# Patient Record
Sex: Male | Born: 1975 | Race: Black or African American | Hispanic: No | Marital: Single | Smoking: Never smoker
Health system: Southern US, Community
[De-identification: ages and names within clinical notes are randomized; demographics above are authoritative.]

## PROBLEM LIST (undated history)

## (undated) DIAGNOSIS — F419 Anxiety disorder, unspecified: Secondary | ICD-10-CM

## (undated) DIAGNOSIS — IMO0001 Reserved for inherently not codable concepts without codable children: Secondary | ICD-10-CM

---

## 2004-09-28 ENCOUNTER — Emergency Department (HOSPITAL_COMMUNITY): Admission: EM | Admit: 2004-09-28 | Discharge: 2004-09-28 | Payer: Self-pay | Admitting: Emergency Medicine

## 2005-12-11 ENCOUNTER — Emergency Department (HOSPITAL_COMMUNITY): Admission: EM | Admit: 2005-12-11 | Discharge: 2005-12-11 | Payer: Self-pay | Admitting: Emergency Medicine

## 2008-08-07 ENCOUNTER — Emergency Department (HOSPITAL_COMMUNITY): Admission: EM | Admit: 2008-08-07 | Discharge: 2008-08-07 | Payer: Self-pay | Admitting: Emergency Medicine

## 2010-03-11 ENCOUNTER — Emergency Department (HOSPITAL_COMMUNITY)
Admission: EM | Admit: 2010-03-11 | Discharge: 2010-03-11 | Payer: Self-pay | Source: Home / Self Care | Admitting: Family Medicine

## 2011-07-22 ENCOUNTER — Encounter (HOSPITAL_COMMUNITY): Payer: Self-pay | Admitting: Emergency Medicine

## 2011-07-22 ENCOUNTER — Emergency Department (HOSPITAL_COMMUNITY): Payer: Self-pay

## 2011-07-22 ENCOUNTER — Emergency Department (HOSPITAL_COMMUNITY)
Admission: EM | Admit: 2011-07-22 | Discharge: 2011-07-22 | Disposition: A | Discharge status: Home / Self Care | Payer: Self-pay | Attending: Emergency Medicine | Admitting: Emergency Medicine

## 2011-07-22 DIAGNOSIS — F1210 Cannabis abuse, uncomplicated: Secondary | ICD-10-CM | POA: Insufficient documentation

## 2011-07-22 DIAGNOSIS — R002 Palpitations: Secondary | ICD-10-CM

## 2011-07-22 DIAGNOSIS — IMO0001 Reserved for inherently not codable concepts without codable children: Secondary | ICD-10-CM | POA: Insufficient documentation

## 2011-07-22 DIAGNOSIS — I498 Other specified cardiac arrhythmias: Secondary | ICD-10-CM | POA: Insufficient documentation

## 2011-07-22 DIAGNOSIS — R011 Cardiac murmur, unspecified: Secondary | ICD-10-CM | POA: Insufficient documentation

## 2011-07-22 HISTORY — DX: Reserved for inherently not codable concepts without codable children: IMO0001

## 2011-07-22 LAB — GLUCOSE, CAPILLARY: Glucose-Capillary: 77 mg/dL (ref 70–99)

## 2011-07-22 MED ORDER — SODIUM CHLORIDE 0.9 % IV BOLUS (SEPSIS)
1000.0000 mL | Freq: Once | INTRAVENOUS | Status: AC
  Administered 2011-07-22: 1000 mL via INTRAVENOUS

## 2011-07-22 NOTE — Discharge Instructions (Signed)
RESOURCE GUIDE  Dental Problems  Patients with Medicaid: [facility]                     [address]                                           Phone:  [phone]                                                  If unable to pay or uninsured, contact:  [facility] or [facility] to become qualified for the adult dental clinic.  Chronic Pain Problems Contact [facility]  [phone] Patients need to be referred by their primary care doctor.  Insufficient Money for Medicine Contact [employer]:  call "211" or [facility] 271-5999.  No Primary Care Doctor Call [facility]  832-8000 Other agencies that provide inexpensive medical care    [facility]  [phone]    [facility]  [phone]    [facility]  [phone]    [facility]  [phone]    [facility]  [phone]    [facility]  [phone]  Substance Abuse Resources [facility]  336-882-2125 [facility] 336-784-9470 [facility] 336-285-9073 [facility] 336-845-3988 Residential & Outpatient Substance Abuse Program  800-659-3381  Psychological Services [facility]  832-9600 [facility]  [phone] [facility]   [phone] (emergency services 641-4993)  Abuse/Neglect [place] [facility] Hotline (336) 641-3795 [place] [facility] Hotline 800-378-5315 (After Hours)  Emergency Shelter [facility] (336) 271-5985  [facility] (336) 275-9566 [facility] (704) 372-4663  MRSA Hotline #:   832-7006    [place] [facility]  [facility] [facility]  [employer]                           [facility] [address] [place]                     [address]         [address]  [place]                                                [place]                              [place] Phone:  [phone]                                  Phone:  [phone]                   Phone:  [phone]  [facility] Phone:  [phone]  [place] Child Abuse Hotline (336) 342-1394 (336)   119-1478 (After Hours)Drug Abuse, Frequently Asked Questions Drug addiction is a complex brain disease. It is characterized by compulsive, at times uncontrollable, drug craving, seeking, and use that persists even in the face of extremely negative results. Drug seeking becomes compulsive, in large part as a result of the effects of prolonged drug use on brain functioning and, thus, on behavior. For many people, drug addiction becomes chronic, with relapses possible even after long periods of being off the drug. HOW QUICKLY CAN I BECOME ADDICTED TO A DRUG? There is no easy answer to this. If and how quickly you might become addicted to a drug depends on many factors including the biology of your body. All drugs are potentially harmful and may have life-threatening consequences associated with their use. There are also vast differences among individuals in sensitivity to various drugs. While one person may use a drug many times and suffer no ill effects, another person may be particularly vulnerable and overdose or developing a craving with the first use. There is no way of knowing in advance how someone may react. HOW DO I KNOW IF SOMEONE IS ADDICTED TO DRUGS? If a person is compulsively seeking and using a drug despite negative consequences (such as loss of job, debt, physical problems brought on by drug abuse, or family problems) then he or she is probably addicted. Those who screen for drug problems, such as physicians, have developed the CAGE questionnaire. These four simple questions can help detect substance abuse problems:  Have you ever felt you ought to Cut down on your drinking/drug use?   Have people ever  Annoyed you by criticizing your drinking/drug use?   Have you ever felt bad or Guilty about your drinking/drug use?   Have you ever had a drink or taken a drug first thing in the morning to steady your nerves or get rid of a hangover (Eye-opener)?  WHAT ARE THE PHYSICAL SIGNS OF ABUSE OR ADDICTION? The physical signs of abuse or addiction can vary depending on the person and the drug being abused. For example, someone who abuses marijuana may have a chronic cough or worsening of asthmatic conditions. THC, the chemical in marijuana responsible for producing its effects, is associated with weakening the immune system which makes the user more vulnerable to infections, such as pneumonia. Each drug has short-term and long-term physical effects. Stimulants like cocaine increase heart rate and blood pressure, whereas opioids like heroin may slow the heart rate and reduce breathing (respiration).  ARE THERE EFFECTIVE TREATMENTS FOR DRUG ADDICTION? Drug addiction can be effectively treated with behavioral-based therapies and, for addiction to some drugs such as heroin or nicotine, medications may be used. Treatment may vary for each person depending on the type of drug(s) being used and multiple courses of treatment may be needed to achieve success. Research has revealed 13 basic principles that underlie effective drug addiction treatment. These are discussed in NIDA's Principles of Drug Addiction Treatment: A Research-Based Guide. WHERE CAN I FIND INFORMATION ABOUT DRUG TREATMENT PROGRAMS?  For referrals to treatment programs, visit the Substance Abuse and Mental Health Services Administration online at http://findtreatment.http://gonzalez-rivas.net/.   NIDA publishes an expanding series of treatment manuals, the "clinical toolbox," that gives drug treatment providers research-based information for creating effective treatment programs.  WHAT IS DETOXIFICATION, OR "DETOX"? Detoxification is the process of allowing the body  to rid itself of a drug while managing the symptoms of withdrawal. It is often the first step in a drug treatment program and should be  followed by treatment with a behavioral-based therapy and/or a medication, if available. Detox alone with no follow-up is not treatment.  WHAT IS WITHDRAWAL? HOW LONG DOES IT LAST? Withdrawal is the variety of symptoms that occur after use of some addictive drugs is reduced or stopped. Length of withdrawal and symptoms vary with the type of drug. For example, physical symptoms of heroin withdrawal may include restlessness, muscle and bone pain, insomnia, diarrhea, vomiting, and cold flashes. These physical symptoms may last for several days, but the general depression, or dysphoria (opposite of euphoria), that often accompanies heroin withdrawal, may last for weeks. In many cases withdrawal can be easily treated with medications to ease the symptoms. But treating withdrawal is not the same as treating addiction.  WHAT ARE THE COSTS OF DRUG ABUSE TO SOCIETY? Beyond the raw numbers are other costs to society:  Spread of infectious diseases such as HIV/AIDS and hepatitis C either through sharing of drug paraphernalia or unprotected sex.   Deaths due to overdose or other complications from drug use.   Effects on unborn children of pregnant drug users.   Other effects such as crime and homelessness.  IF A PREGNANT WOMAN ABUSES DRUGS, DOES IT AFFECT THE FETUS?  Many substances including alcohol, nicotine, and drugs of abuse can have negative effects on the developing fetus because they are transferred to the fetus across the placenta. For example, nicotine has been connected with premature birth and low birth weight, as has the use of cocaine. Scientific studies have shown that babies born to marijuana users were shorter, weighed less, and had smaller head sizes than those born to mothers who did not use the drug. Smaller babies are more likely to develop health problems.     Whether a baby's health problems, if caused by a drug, will continue as the child grows, is not always known. Research does show that children born to mothers who used marijuana regularly during pregnancy may have trouble concentrating, when older. Our research continues to produce insights on the negative effects of drug use on the fetus.  Document Released: 02/14/2003 Document Revised: 01/31/2011 Document Reviewed: 05/13/2008 [facility] Patient Information 2012 [place], [place].Palpitations  A palpitation is the feeling that your heartbeat is irregular or is faster than normal. Although this is frightening, it usually is not serious. Palpitations may be caused by excesses of smoking, caffeine, or alcohol. They are also brought on by stress and anxiety. Sometimes, they are caused by heart disease. Unless otherwise noted, your caregiver did not find any signs of serious illness at this time. HOME CARE INSTRUCTIONS  To help prevent palpitations:  Drink decaffeinated coffee, tea, and soda pop. Avoid chocolate.   If you smoke or drink alcohol, quit or cut down as much as possible.   Reduce your stress or anxiety level. Biofeedback, yoga, or meditation will help you relax. Physical activity such as swimming, jogging, or walking also may be helpful.  SEEK MEDICAL CARE IF:   You continue to have a fast heartbeat.   Your palpitations occur more often.  SEEK IMMEDIATE MEDICAL CARE IF: You develop chest pain, shortness of breath, severe headache, dizziness, or fainting. Document Released: 02/09/2000 Document Revised: 01/31/2011 Document Reviewed: 04/10/2007 [facility] Patient Information 2012 [place], [place].

## 2011-07-22 NOTE — ED Notes (Signed)
Pt c/o sudden onset of palpitations tonight while sitting on couch.  Was SOB at the time.  Currently still feels sensation of "heart beating in his chest".  Denies pain, diaphoresis, n/v, LOC.

## 2011-07-22 NOTE — ED Notes (Signed)
Rx x 0, pt voiced understanding to f/u with PCP and return for worsening of sx 

## 2011-07-22 NOTE — ED Provider Notes (Signed)
History     CSN: [id number]  Arrival date & time 07/22/11  2157   First MD Initiated Contact with Patient 07/22/11 2214      Chief Complaint  Patient presents with  . Palpitations    (Consider location/radiation/quality/duration/timing/severity/associated sxs/prior treatment) HPI Comments: Sitting on couch and felt palpitations.  Smoked marijuana which did not help.  No chest pain or dyspnea.  Has not had before.  Good PO intake and otherwise doing well.   Patient is a 36 y.o. male presenting with palpitations. The history is provided by the patient.  Palpitations  This is a new problem. The current episode started less than 1 hour ago. The problem occurs constantly. The problem has not changed since onset.Associated with: began while sitting on the couch. On average, each episode lasts 1 hour. Pertinent negatives include no diaphoresis, no fever, no chest pain, no chest pressure, no irregular heartbeat, no near-syncope, no syncope, no nausea, no vomiting, no headaches and no shortness of breath. He has tried nothing for the symptoms.    Past Medical History  Diagnosis Date  . No significant past medical history     History reviewed. No pertinent past surgical history.  History reviewed. No pertinent family history.  History  Substance Use Topics  . Smoking status: Never Smoker   . Smokeless tobacco: Not on file  . Alcohol Use: No      Review of Systems  Constitutional: Negative for fever, diaphoresis, activity change and fatigue.  HENT: Negative for congestion.   Eyes: Negative for pain.  Respiratory: Negative for chest tightness, shortness of breath, wheezing and stridor.   Cardiovascular: Positive for palpitations. Negative for chest pain, leg swelling, syncope and near-syncope.  Gastrointestinal: Negative for nausea and vomiting.  Genitourinary: Negative for dysuria.  Musculoskeletal: Negative for arthralgias.  Skin: Negative for rash.  Neurological: Negative  for headaches.  Psychiatric/Behavioral: Negative for behavioral problems.    Allergies  Review of patient's allergies indicates no known allergies.  Home Medications  No current outpatient prescriptions on file.  BP 153/71  Pulse 115  Temp(Src) 98 F (36.7 C) (Oral)  Resp 18  SpO2 99%  Physical Exam  Constitutional: He is oriented to person, place, and time. He appears well-developed and well-nourished. No distress.  HENT:  Head: Normocephalic and atraumatic.  Eyes: Conjunctivae and EOM are normal. Pupils are equal, round, and reactive to light. No scleral icterus.  Neck: Normal range of motion. Neck supple.  Cardiovascular: Regular rhythm.  Exam reveals no gallop and no friction rub.   Murmur (2/6 SEM) heard.      Tachy 108  Pulmonary/Chest: Effort normal and breath sounds normal. No respiratory distress. He has no wheezes. He has no rales. He exhibits no tenderness.  Abdominal: Soft. He exhibits no distension and no mass. There is no tenderness. There is no rebound and no guarding.  Musculoskeletal: Normal range of motion. He exhibits no edema and no tenderness.  Neurological: He is alert and oriented to person, place, and time. He has normal reflexes. No cranial nerve deficit. He exhibits normal muscle tone. Coordination normal.  Skin: Skin is warm and dry. No rash noted. He is not diaphoretic. No erythema.  Psychiatric: He has a normal mood and affect. His behavior is normal. Judgment and thought content normal.    ED Course  Procedures (including critical care time)   Date: 07/22/2011  Rate: 104  Rhythm: sinus tachycardia  QRS Axis: normal  Intervals: normal  ST/T Wave abnormalities: normal  Conduction Disutrbances:none  Narrative Interpretation:   Old EKG Reviewed: none available    Labs Reviewed - No data to display No results found.   1. Palpitations       MDM  Sitting on couch and felt palpitations.  Smoked marijuana which did not help.  No chest  pain or dyspnea.  Has not had before.  Good PO intake and otherwise doing well.  VSS and well appearing.  EKG unconcerning.  CXR without cardiomegaly.  Pulse now in 80s and pt feeling better.  Safe for d/c.  Gave info to establish PCP.  Pt comfortable with plan and will follow up.         Army Chaco, MD 07/22/11 2249

## 2011-07-22 NOTE — ED Notes (Addendum)
Patient complaining of hear racing that started thirty minutes ago while watching TV.  Patient denies chest pain; reports shortness of breath.  Patient reports smoking marijuana before palpitations started.  Sclera red.  HR 130's-150's on monitor (ST).

## 2011-07-23 NOTE — ED Provider Notes (Signed)
I saw and evaluated the patient, reviewed the resident's note and I agree with the findings and plan.  I saw the patient along with Dr. Maisie Fus and agree with his note, assessment, and plan.  The patient presents with palpitations, heart racing that started about one hour ago.  This began right around the time he smoked marijuana.  He denies chest pain or shortness of breath.  He denies other drug use.  No exertional symptoms.  On exam, the patient is afebrile and vital signs are stable.  The heart is rrr without mrg.  Lungs are clear and equal bilaterally.  The ekg reveals a nsr without ectopy and the patient is feeling well.  At this point, he will be discharged to home.  To return if symptoms recur and follow up with pcp to discuss event monitoring if symptoms persist.  Geoffery Lyons, MD 07/23/11 1221

## 2012-06-09 ENCOUNTER — Emergency Department (HOSPITAL_COMMUNITY)
Admission: EM | Admit: 2012-06-09 | Discharge: 2012-06-10 | Disposition: A | Discharge status: Home / Self Care | Payer: Self-pay | Attending: Emergency Medicine | Admitting: Emergency Medicine

## 2012-06-09 ENCOUNTER — Encounter (HOSPITAL_COMMUNITY): Payer: Self-pay | Admitting: Emergency Medicine

## 2012-06-09 DIAGNOSIS — M67432 Ganglion, left wrist: Secondary | ICD-10-CM

## 2012-06-09 DIAGNOSIS — X503XXA Overexertion from repetitive movements, initial encounter: Secondary | ICD-10-CM | POA: Insufficient documentation

## 2012-06-09 DIAGNOSIS — Y9289 Other specified places as the place of occurrence of the external cause: Secondary | ICD-10-CM | POA: Insufficient documentation

## 2012-06-09 DIAGNOSIS — S43499A Other sprain of unspecified shoulder joint, initial encounter: Secondary | ICD-10-CM | POA: Insufficient documentation

## 2012-06-09 DIAGNOSIS — IMO0002 Reserved for concepts with insufficient information to code with codable children: Secondary | ICD-10-CM | POA: Insufficient documentation

## 2012-06-09 DIAGNOSIS — Y990 Civilian activity done for income or pay: Secondary | ICD-10-CM | POA: Insufficient documentation

## 2012-06-09 DIAGNOSIS — Y9389 Activity, other specified: Secondary | ICD-10-CM | POA: Insufficient documentation

## 2012-06-09 DIAGNOSIS — M6740 Ganglion, unspecified site: Secondary | ICD-10-CM | POA: Insufficient documentation

## 2012-06-09 DIAGNOSIS — S46811A Strain of other muscles, fascia and tendons at shoulder and upper arm level, right arm, initial encounter: Secondary | ICD-10-CM

## 2012-06-09 MED ORDER — NAPROXEN 375 MG PO TABS: 375.0000 mg | ORAL_TABLET | Freq: Two times a day (BID) | ORAL | Status: DC

## 2012-06-09 NOTE — ED Notes (Signed)
PT. 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

## 2012-06-09 NOTE — ED Provider Notes (Signed)
History     CSN: [id number]  Arrival date & time 06/09/12  2239   First MD Initiated Contact with Patient 06/09/12 2330      Chief Complaint  Patient presents with  . Wrist Pain  . Back Pain    (Consider location/radiation/quality/duration/timing/severity/associated sxs/prior treatment) HPI Comments: Mr. Nanna is a 37 year old gentleman, who presents to the emergency department, fast track area, with 2 complaints today first being a, not, and painful area on the dorsal aspect of his left wrist.  That has come up in the last several, days, and is painful to the touch.  His second complaint is he has pain under his right scapula at the end of his workday.  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  He has taken Tylenol once a day for the past several, days, without relief.  He has had to call out of work for the past several, days to to his discomfort.  Patient is a 37 y.o. male presenting with wrist pain and back pain. The history is provided by the patient.  Wrist Pain This is a new problem. The current episode started in the past 7 days. The problem occurs constantly. The problem has been gradually worsening.  Back Pain   Past Medical History  Diagnosis Date  . No significant past medical history     History reviewed. No pertinent past surgical history.  No family history on file.  History  Substance Use Topics  . Smoking status: Never Smoker   . Smokeless tobacco: Not on file  . Alcohol Use: No      Review of Systems  Unable to perform ROS Constitutional: Negative.   Musculoskeletal: Positive for back pain.  Skin: Negative for wound.  All other systems reviewed and are negative.    Allergies  Review of patient's allergies indicates no known allergies.  Home Medications   Current Outpatient Rx  Name  Route  Sig  Dispense  Refill  . acetaminophen (TYLENOL) 500 MG tablet   Oral   Take 500 mg by mouth every 6 (six) hours as  needed for pain.         . naproxen (NAPROSYN) 375 MG tablet   Oral   Take 1 tablet (375 mg total) by mouth 2 (two) times daily.   20 tablet   0     BP 129/78  Pulse 68  Temp(Src) 98.7 F (37.1 C) (Oral)  Resp 18  SpO2 98%  Physical Exam  Nursing note and vitals reviewed. Constitutional: He appears well-developed and well-nourished.  HENT:  Head: Normocephalic.  Eyes: Pupils are equal, round, and reactive to light.  Cardiovascular: Normal rate.   Pulmonary/Chest: Effort normal.  Musculoskeletal: Normal range of motion. He exhibits tenderness.       Left wrist: He exhibits tenderness. He exhibits normal range of motion, no swelling, no effusion, no deformity and no laceration.       Arms: Pain in this area with certain movements, relieved by rest The dorsal aspect of the left wrist shows a small ganglion    ED Course  Procedures (including critical care time)  Labs Reviewed - No data to display No results found.   1. Ganglion cyst of wrist, left   2. Trapezius strain, right, initial encounter       MDM   Will refer patient to hand for his ganglion cyst.  Treatment is requesting a work note, and work with rest for the  next several days ago, prescribed a nonsteroidal on a regular basis         Arman Filter, NP 06/10/12 0001

## 2012-06-10 NOTE — ED Provider Notes (Signed)
Medical screening examination/treatment/procedure(s) were performed by non-physician practitioner and as supervising physician I was immediately available for consultation/collaboration.    Gayl Ivanoff R Demarious Kapur, MD 06/10/12 0425 

## 2012-06-10 NOTE — ED Notes (Signed)
Pt states understanding of discharge instructions 

## 2012-06-26 ENCOUNTER — Encounter (HOSPITAL_COMMUNITY): Payer: Self-pay | Admitting: *Deleted

## 2012-06-26 ENCOUNTER — Emergency Department (INDEPENDENT_AMBULATORY_CARE_PROVIDER_SITE_OTHER)
Admission: EM | Admit: 2012-06-26 | Discharge: 2012-06-26 | Disposition: A | Discharge status: Home / Self Care | Payer: Self-pay | Source: Home / Self Care | Attending: Family Medicine | Admitting: Family Medicine

## 2012-06-26 DIAGNOSIS — M778 Other enthesopathies, not elsewhere classified: Secondary | ICD-10-CM

## 2012-06-26 DIAGNOSIS — M65839 Other synovitis and tenosynovitis, unspecified forearm: Secondary | ICD-10-CM

## 2012-06-26 MED ORDER — DICLOFENAC POTASSIUM 50 MG PO TABS: 50.0000 mg | ORAL_TABLET | Freq: Three times a day (TID) | ORAL | Status: DC

## 2012-06-26 NOTE — ED Notes (Addendum)
C/o pain dorsum L wrist pain onset 2 weeks ago.  States no known injury but thinks its from repetitive motion at work.  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  Also pain in L thumb and pain radiates up to dorsal and ventral aspects of his forearm.  C/o grip is not the same, feels weaker.

## 2012-06-26 NOTE — ED Notes (Signed)
Pt. requested work note. Note done as directed by Dr. Artis Flock. Andre Miller 06/26/2012

## 2012-06-26 NOTE — ED Provider Notes (Signed)
History     CSN: [id number]  Arrival date & time 06/26/12  1717   First MD Initiated Contact with Patient 06/26/12 1723      Chief Complaint  Patient presents with  . Wrist Pain    (Consider location/radiation/quality/duration/timing/severity/associated sxs/prior treatment) Patient is a 37 y.o. male presenting with wrist pain. The history is provided by the patient.  Wrist Pain This is a chronic problem. The current episode started more than 1 week ago (wrist pain for 2 weeks, , seen in ER on 4/15 with ganglion which has resolved.but wrist pain persists.). The problem has not changed since onset.   Past Medical History  Diagnosis Date  . No significant past medical history     History reviewed. No pertinent past surgical history.  Family History  Problem Relation Age of Onset  . Ovarian cancer Mother     History  Substance Use Topics  . Smoking status: Never Smoker   . Smokeless tobacco: Not on file  . Alcohol Use: No      Review of Systems  Constitutional: Negative.   Musculoskeletal: Negative for joint swelling.  Skin: Negative.     Allergies  Review of patient's allergies indicates no known allergies.  Home Medications   Current Outpatient Rx  Name  Route  Sig  Dispense  Refill  . acetaminophen (TYLENOL) 500 MG tablet   Oral   Take 500 mg by mouth every 6 (six) hours as needed for pain.         . naproxen (NAPROSYN) 375 MG tablet   Oral   Take 1 tablet (375 mg total) by mouth 2 (two) times daily.   20 tablet   0     BP 126/75  Pulse 67  Temp(Src) 98.5 F (36.9 C) (Oral)  Resp 18  SpO2 99%  Physical Exam  Nursing note reviewed. Constitutional: He is oriented to person, place, and time. He appears well-developed.  Musculoskeletal: He exhibits tenderness.       Left wrist: He exhibits decreased range of motion, tenderness and swelling. He exhibits no deformity.  Neurological: He is alert and oriented to person, place, and time.  Skin:  Skin is warm and dry.    ED Course  Procedures (including critical care time)  Labs Reviewed - No data to display No results found.   No diagnosis found.    MDM          Linna Hoff, MD 06/26/12 [phone]

## 2013-06-05 ENCOUNTER — Emergency Department (HOSPITAL_COMMUNITY)
Admission: EM | Admit: 2013-06-05 | Discharge: 2013-06-06 | Disposition: A | Discharge status: Home / Self Care | Payer: Self-pay | Attending: Emergency Medicine | Admitting: Emergency Medicine

## 2013-06-05 ENCOUNTER — Encounter (HOSPITAL_COMMUNITY): Payer: Self-pay | Admitting: Emergency Medicine

## 2013-06-05 DIAGNOSIS — R519 Headache, unspecified: Secondary | ICD-10-CM

## 2013-06-05 DIAGNOSIS — R112 Nausea with vomiting, unspecified: Secondary | ICD-10-CM | POA: Insufficient documentation

## 2013-06-05 DIAGNOSIS — R51 Headache: Secondary | ICD-10-CM | POA: Insufficient documentation

## 2013-06-05 DIAGNOSIS — R5381 Other malaise: Secondary | ICD-10-CM | POA: Insufficient documentation

## 2013-06-05 DIAGNOSIS — Z791 Long term (current) use of non-steroidal anti-inflammatories (NSAID): Secondary | ICD-10-CM | POA: Insufficient documentation

## 2013-06-05 DIAGNOSIS — R5383 Other fatigue: Secondary | ICD-10-CM

## 2013-06-05 LAB — CBC WITH DIFFERENTIAL/PLATELET
BASOS ABS: 0 10*3/uL (ref 0.0–0.1)
BASOS PCT: 0 % (ref 0–1)
EOS PCT: 4 % (ref 0–5)
Eosinophils Absolute: 0.3 10*3/uL (ref 0.0–0.7)
HCT: 40.3 % (ref 39.0–52.0)
Hemoglobin: 13.5 g/dL (ref 13.0–17.0)
LYMPHS PCT: 32 % (ref 12–46)
Lymphs Abs: 2.5 10*3/uL (ref 0.7–4.0)
MCH: 28.7 pg (ref 26.0–34.0)
MCHC: 33.5 g/dL (ref 30.0–36.0)
MCV: 85.6 fL (ref 78.0–100.0)
Monocytes Absolute: 0.8 10*3/uL (ref 0.1–1.0)
Monocytes Relative: 10 % (ref 3–12)
NEUTROS ABS: 4.2 10*3/uL (ref 1.7–7.7)
Neutrophils Relative %: 54 % (ref 43–77)
PLATELETS: 247 10*3/uL (ref 150–400)
RBC: 4.71 MIL/uL (ref 4.22–5.81)
RDW: 13.5 % (ref 11.5–15.5)
WBC: 7.8 10*3/uL (ref 4.0–10.5)

## 2013-06-05 MED ORDER — ONDANSETRON HCL 4 MG/2ML IJ SOLN
4.0000 mg | Freq: Once | INTRAMUSCULAR | Status: AC
  Administered 2013-06-05: 4 mg via INTRAVENOUS
  Filled 2013-06-05: qty 2

## 2013-06-05 MED ORDER — SODIUM CHLORIDE 0.9 % IV BOLUS (SEPSIS)
1000.0000 mL | Freq: Once | INTRAVENOUS | Status: AC
  Administered 2013-06-05: 1000 mL via INTRAVENOUS

## 2013-06-05 NOTE — ED Notes (Signed)
Patient is alert and oriented x3.  He is complaining of a fever with nausea and vomiting.   He denies being around anyone that has been sick.  Currently he rates his headache  6 of 10 with nausea and vomiting.

## 2013-06-05 NOTE — ED Provider Notes (Signed)
CSN: [id number]     Arrival date & time 06/05/13  2206 History   First MD Initiated Contact with Patient 06/05/13 2241     Chief Complaint  Patient presents with  . Fever   HPI  Andre Miller is a 38 y.o. male with no PMH who presents to the ED for evaluation of fever. History was provided by the patient. Patient states he was not feeling well starting 5 days ago (06/01/13) and went home from work. He states that he developed nausea and vomiting the next day and continued to vomit until yesterday (3 days). Patient denies any diarrhea, constipation, dysuria, abdominal pain, or back pain. No hematemesis. Nausea has continued, however, patient did not vomit today. Patient also has developed a generalized aching headache which has improved today. Patient denies any vision changes, neck pain, myalgias, weakness, loss of sensation, numbness, tingling, difficulty walking or speaking, or confusion. Patient also has had a subjective fever with alternating chills. Did not take his temperature. He also has had decreased appetite and is "scared to eat anything" because "I'm afraid I'm going to throw up." Denies any sick contacts. Denies any cough, rhinorrhea, sore throat, ear pain, dizziness or lightheadedness. Has not taken anything to treat his symptoms. No recent travel.    Past Medical History  Diagnosis Date  . No significant past medical history    History reviewed. No pertinent past surgical history. Family History  Problem Relation Age of Onset  . Ovarian cancer Mother    History  Substance Use Topics  . Smoking status: Never Smoker   . Smokeless tobacco: Not on file  . Alcohol Use: No    Review of Systems  Constitutional: Positive for fever (subjective), chills, appetite change and fatigue. Negative for diaphoresis, activity change and unexpected weight change.  HENT: Negative for congestion, rhinorrhea and sore throat.   Eyes: Negative for photophobia and visual disturbance.   Respiratory: Negative for cough.   Cardiovascular: Negative for chest pain.  Gastrointestinal: Positive for nausea and vomiting. Negative for abdominal pain, diarrhea and constipation.  Genitourinary: Negative for dysuria and testicular pain.  Musculoskeletal: Negative for back pain and myalgias.  Skin: Negative for color change and rash.  Neurological: Positive for headaches. Negative for dizziness, weakness, light-headedness and numbness.    Allergies  Review of patient's allergies indicates no known allergies.  Home Medications   Current Outpatient Rx  Name  Route  Sig  Dispense  Refill  . acetaminophen (TYLENOL) 500 MG tablet   Oral   Take 500 mg by mouth every 6 (six) hours as needed for pain.         Marland Kitchen diclofenac (CATAFLAM) 50 MG tablet   Oral   Take 1 tablet (50 mg total) by mouth 3 (three) times daily.   30 tablet   0   . naproxen (NAPROSYN) 375 MG tablet   Oral   Take 1 tablet (375 mg total) by mouth 2 (two) times daily.   20 tablet   0    BP 132/79  Pulse 74  Temp(Src) 99.9 F (37.7 C) (Oral)  Resp 14  SpO2 99%  Filed Vitals:   06/05/13 2212 06/05/13 2332 06/06/13 0126  BP: 132/79 126/77 113/71  Pulse: 74  67  Temp: 99.9 F (37.7 C) 98.5 F (36.9 C) 98.6 F (37 C)  TempSrc: Oral Oral Oral  Resp: 14 18   SpO2: 99% 99% 99%    Physical Exam  Nursing note and vitals reviewed. Constitutional:  He is oriented to person, place, and time. He appears well-developed and well-nourished. No distress.  Non-toxic  HENT:  Head: Normocephalic and atraumatic.  Right Ear: External ear normal.  Left Ear: External ear normal.  Nose: Nose normal.  Mouth/Throat: Oropharynx is clear and moist. No oropharyngeal exudate.  Tympanic membranes gray and translucent bilaterally with no erythema, edema, or hemotympanum.  No mastoid or tragal tenderness bilaterally. No erythema to the posterior pharynx. Tonsils without edema or exudates. Uvula midline. No trismus. No  difficulty controlling secretions.   Eyes: Conjunctivae are normal. Pupils are equal, round, and reactive to light. Right eye exhibits no discharge. Left eye exhibits no discharge.  Neck: Normal range of motion. Neck supple.  No cervical lymphadenopathy. No nuchal rigidity.   Cardiovascular: Normal rate, regular rhythm, normal heart sounds and intact distal pulses.  Exam reveals no gallop and no friction rub.   No murmur heard. Pulmonary/Chest: Effort normal and breath sounds normal. No respiratory distress. He has no wheezes. He has no rales. He exhibits no tenderness.  Abdominal: Soft. Bowel sounds are normal. He exhibits no distension and no mass. There is no tenderness. There is no rebound and no guarding.  Musculoskeletal: Normal range of motion. He exhibits no edema and no tenderness.  Patient moving all extremities. Patient able to ambulate without difficulty or ataxia  Neurological: He is alert and oriented to person, place, and time.  GCS 15.  No focal neurological deficits.  CN 2-12 intact.  No pronator drift.   Skin: Skin is warm and dry. No rash noted. He is not diaphoretic.    ED Course  Procedures (including critical care time) Labs Review Labs Reviewed - No data to display Imaging Review No results found.   EKG Interpretation None      Results for orders placed during the hospital encounter of 06/05/13  CBC WITH DIFFERENTIAL      Result Value Ref Range   WBC 7.8  4.0 - 10.5 K/uL   RBC 4.71  4.22 - 5.81 MIL/uL   Hemoglobin 13.5  13.0 - 17.0 g/dL   HCT 16.1  09.6 - 04.5 %   MCV 85.6  78.0 - 100.0 fL   MCH 28.7  26.0 - 34.0 pg   MCHC 33.5  30.0 - 36.0 g/dL   RDW 40.9  81.1 - 91.4 %   Platelets 247  150 - 400 K/uL   Neutrophils Relative % 54  43 - 77 %   Neutro Abs 4.2  1.7 - 7.7 K/uL   Lymphocytes Relative 32  12 - 46 %   Lymphs Abs 2.5  0.7 - 4.0 K/uL   Monocytes Relative 10  3 - 12 %   Monocytes Absolute 0.8  0.1 - 1.0 K/uL   Eosinophils Relative 4  0 - 5 %    Eosinophils Absolute 0.3  0.0 - 0.7 K/uL   Basophils Relative 0  0 - 1 %   Basophils Absolute 0.0  0.0 - 0.1 K/uL  COMPREHENSIVE METABOLIC PANEL      Result Value Ref Range   Sodium 138  137 - 147 mEq/L   Potassium 3.8  3.7 - 5.3 mEq/L   Chloride 100  96 - 112 mEq/L   CO2 27  19 - 32 mEq/L   Glucose, Bld 86  70 - 99 mg/dL   BUN 16  6 - 23 mg/dL   Creatinine, Ser 7.82  0.50 - 1.35 mg/dL   Calcium 9.6  8.4 - 95.6  mg/dL   Total Protein 7.4  6.0 - 8.3 g/dL   Albumin 4.1  3.5 - 5.2 g/dL   AST 21  0 - 37 U/L   ALT 14  0 - 53 U/L   Alkaline Phosphatase 78  39 - 117 U/L   Total Bilirubin 0.3  0.3 - 1.2 mg/dL   GFR calc non Af Amer >90  >90 mL/min   GFR calc Af Amer >90  >90 mL/min  LIPASE, BLOOD      Result Value Ref Range   Lipase 23  11 - 59 U/L  URINALYSIS, ROUTINE W REFLEX MICROSCOPIC      Result Value Ref Range   Color, Urine YELLOW  YELLOW   APPearance CLEAR  CLEAR   Specific Gravity, Urine 1.029  1.005 - 1.030   pH 5.5  5.0 - 8.0   Glucose, UA NEGATIVE  NEGATIVE mg/dL   Hgb urine dipstick NEGATIVE  NEGATIVE   Bilirubin Urine NEGATIVE  NEGATIVE   Ketones, ur NEGATIVE  NEGATIVE mg/dL   Protein, ur NEGATIVE  NEGATIVE mg/dL   Urobilinogen, UA 0.2  0.0 - 1.0 mg/dL   Nitrite NEGATIVE  NEGATIVE   Leukocytes, UA NEGATIVE  NEGATIVE     MDM   Andre Miller is a 38 y.o. male with no PMH who presents to the ED for evaluation of fever.  Rechecks  1:00 AM = No nausea. Mild headache. Ordering Tylenol.  1:35 AM = Tolerating fluids and crackers without difficulty. No abdominal pain. Repeat abdominal exam benign. Headache almost resolved. No nausea. States he feels much better. Headache improved from a 6/10 to a 2/10 with IV fluids.    Patient initially presented to the ED with the chief complaint of fever. Subjective fever at home with no recorded temp. Patient afebrile throughout his ED visit and nontoxic in appearance. No meningeal signs. Patient also complained of nausea  and vomiting. Patient able to tolerate PO fluids without any difficulty or emesis. Patient denied any abdominal pain during my assessment (nursing notes reviewed which recorded complaints of abdominal pain). Abdominal exam benign. Labs unremarkable with no leukocytosis or electrolyte abnormality. Vital signs stable. Patient also complained of a headache which improved throughout his ED visit. No focal neurological deficits. Etiology of headache likely due to to dehydration. Headache improved with IV fluids during his ED visit. Patient encouraged to drink fluids and rest. Zofran prescribed for outpatient management. Discussed followup with primary care provider. Return precautions, discharge instructions, and follow-up was discussed with the patient before discharge.     Discharge Medication List as of 06/06/2013  1:41 AM    START taking these medications   Details  ondansetron (ZOFRAN ODT) 4 MG disintegrating tablet Take 1 tablet (4 mg total) by mouth every 8 (eight) hours as needed for nausea., Starting 06/06/2013, Until Discontinued, Print        Final impressions: 1. Nausea and vomiting   2. Headache       Greer EeJessica Katlin Oyinkansola Truax PA-C          Jillyn LedgerJessica K Mikah Poss, PA-C 06/07/13 [phone]

## 2013-06-06 LAB — COMPREHENSIVE METABOLIC PANEL
ALT: 14 U/L (ref 0–53)
AST: 21 U/L (ref 0–37)
Albumin: 4.1 g/dL (ref 3.5–5.2)
Alkaline Phosphatase: 78 U/L (ref 39–117)
BILIRUBIN TOTAL: 0.3 mg/dL (ref 0.3–1.2)
BUN: 16 mg/dL (ref 6–23)
CALCIUM: 9.6 mg/dL (ref 8.4–10.5)
CO2: 27 mEq/L (ref 19–32)
CREATININE: 1.02 mg/dL (ref 0.50–1.35)
Chloride: 100 mEq/L (ref 96–112)
GLUCOSE: 86 mg/dL (ref 70–99)
POTASSIUM: 3.8 meq/L (ref 3.7–5.3)
Sodium: 138 mEq/L (ref 137–147)
Total Protein: 7.4 g/dL (ref 6.0–8.3)

## 2013-06-06 LAB — URINALYSIS, ROUTINE W REFLEX MICROSCOPIC
BILIRUBIN URINE: NEGATIVE
Glucose, UA: NEGATIVE mg/dL
Hgb urine dipstick: NEGATIVE
KETONES UR: NEGATIVE mg/dL
Leukocytes, UA: NEGATIVE
Nitrite: NEGATIVE
PH: 5.5 (ref 5.0–8.0)
PROTEIN: NEGATIVE mg/dL
Specific Gravity, Urine: 1.029 (ref 1.005–1.030)
Urobilinogen, UA: 0.2 mg/dL (ref 0.0–1.0)

## 2013-06-06 LAB — LIPASE, BLOOD: LIPASE: 23 U/L (ref 11–59)

## 2013-06-06 MED ORDER — ACETAMINOPHEN 325 MG PO TABS
650.0000 mg | ORAL_TABLET | Freq: Once | ORAL | Status: AC
  Administered 2013-06-06: 650 mg via ORAL
  Filled 2013-06-06: qty 2

## 2013-06-06 MED ORDER — ONDANSETRON 4 MG PO TBDP: 4.0000 mg | ORAL_TABLET | Freq: Three times a day (TID) | ORAL | Status: DC | PRN

## 2013-06-06 NOTE — Discharge Instructions (Signed)
Drink fluids and rest  Clear liquid diet for 24-48 hours - see below  Zofran for nausea and vomiting  Return to the emergency department if you develop any changing/worsening condition, fever, abdominal pain, repeated vomiting, or any other concerns (please read additional information regarding your condition below)'   Nausea and Vomiting Nausea is a sick feeling that often comes before throwing up (vomiting). Vomiting is a reflex where stomach contents come out of your mouth. Vomiting can cause severe loss of body fluids (dehydration). Children and elderly adults can become dehydrated quickly, especially if they also have diarrhea. Nausea and vomiting are symptoms of a condition or disease. It is important to find the cause of your symptoms. CAUSES   Direct irritation of the stomach lining. This irritation can result from increased acid production (gastroesophageal reflux disease), infection, food poisoning, taking certain medicines (such as nonsteroidal anti-inflammatory drugs), alcohol use, or tobacco use.  Signals from the brain.These signals could be caused by a headache, heat exposure, an inner ear disturbance, increased pressure in the brain from injury, infection, a tumor, or a concussion, pain, emotional stimulus, or metabolic problems.  An obstruction in the gastrointestinal tract (bowel obstruction).  Illnesses such as diabetes, hepatitis, gallbladder problems, appendicitis, kidney problems, cancer, sepsis, atypical symptoms of a heart attack, or eating disorders.  Medical treatments such as chemotherapy and radiation.  Receiving medicine that makes you sleep (general anesthetic) during surgery. DIAGNOSIS Your caregiver may ask for tests to be done if the problems do not improve after a few days. Tests may also be done if symptoms are severe or if the reason for the nausea and vomiting is not clear. Tests may include:  Urine tests.  Blood tests.  Stool tests.  Cultures (to  look for evidence of infection).  X-rays or other imaging studies. Test results can help your caregiver make decisions about treatment or the need for additional tests. TREATMENT You need to stay well hydrated. Drink frequently but in small amounts.You may wish to drink water, sports drinks, clear broth, or eat frozen ice pops or gelatin dessert to help stay hydrated.When you eat, eating slowly may help prevent nausea.There are also some antinausea medicines that may help prevent nausea. HOME CARE INSTRUCTIONS   Take all medicine as directed by your caregiver.  If you do not have an appetite, do not force yourself to eat. However, you must continue to drink fluids.  If you have an appetite, eat a normal diet unless your caregiver tells you differently.  Eat a variety of complex carbohydrates (rice, wheat, potatoes, bread), lean meats, yogurt, fruits, and vegetables.  Avoid high-fat foods because they are more difficult to digest.  Drink enough water and fluids to keep your urine clear or pale yellow.  If you are dehydrated, ask your caregiver for specific rehydration instructions. Signs of dehydration may include:  Severe thirst.  Dry lips and mouth.  Dizziness.  Dark urine.  Decreasing urine frequency and amount.  Confusion.  Rapid breathing or pulse. SEEK IMMEDIATE MEDICAL CARE IF:   You have blood or brown flecks (like coffee grounds) in your vomit.  You have black or bloody stools.  You have a severe headache or stiff neck.  You are confused.  You have severe abdominal pain.  You have chest pain or trouble breathing.  You do not urinate at least once every 8 hours.  You develop cold or clammy skin.  You continue to vomit for longer than 24 to 48 hours.  You  have a fever. MAKE SURE YOU:   Understand these instructions.  Will watch your condition.  Will get help right away if you are not doing well or get worse. Document Released: 02/11/2005  Document Revised: 05/06/2011 Document Reviewed: 07/11/2010 [facility] Patient Information 2014 [employer], [place].  Diet The clear liquid diet consists of foods that are liquid or will become liquid at room temperature. Examples of foods allowed on a clear liquid diet include fruit juice, broth or bouillon, gelatin, or frozen ice pops. You should be able to see through the liquid. The purpose of this diet is to provide the necessary fluids, electrolytes (such as sodium and potassium), and energy to keep the body functioning during times when you are not able to consume a regular diet. A clear liquid diet should not be continued for long periods of time, as it is not nutritionally adequate.  A CLEAR LIQUID DIET MAY BE NEEDED: When a sudden-onset (acute) condition occurs before or after surgery.  As the first step in oral feeding.  For fluid and electrolyte replacement in diarrheal diseases.  As a diet before certain medical tests are performed.  ADEQUACY The clear liquid diet is adequate only in ascorbic acid, according to the Recommended Dietary Allowances of the [employer] Research Council.  CHOOSING FOODS Breads and Starches Allowed: None are allowed.  Avoid: All are to be avoided.  Vegetables Allowed: Strained vegetable juices.  Avoid: Any others.  Fruit Allowed: Strained fruit juices and fruit drinks. Include 1 serving of citrus or vitamin C-enriched fruit juice daily.  Avoid: Any others.  Meat and Meat Substitutes Allowed: None are allowed.  Avoid: All are to be avoided.  Milk Products Allowed: None are allowed.  Avoid: All are to be avoided.  Soups and Combination Foods Allowed: Clear bouillon, broth, or strained broth-based soups.  Avoid: Any others.  Desserts and Sweets Allowed: Sugar, honey. High-protein gelatin. Flavored gelatin, ices, or frozen ice pops that do not contain milk.  Avoid: Any others.  Fats and Oils Allowed: None are  allowed.  Avoid: All are to be avoided.  Beverages Allowed: Cereal beverages, coffee (regular or decaffeinated), tea, or soda at the discretion of your health care provider.  Avoid: Any others.  Condiments Allowed: Salt.  Avoid: Any others, including pepper.  Supplements Allowed: Liquid nutrition beverages that you can see through.  Avoid: Any others that contain lactose or fiber. SAMPLE MEAL PLAN Breakfast 4 oz (120 mL) strained orange juice.  to 1 cup (120 to 240 mL) gelatin (plain or fortified). 1 cup (240 mL) beverage (coffee or tea). Sugar, if desired. Midmorning Snack  cup (120 mL) gelatin (plain or fortified). Lunch 1 cup (240 mL) broth or consomm. 4 oz (120 mL) strained grapefruit juice.  cup (120 mL) gelatin (plain or fortified). 1 cup (240 mL) beverage (coffee or tea). Sugar, if desired. Midafternoon Snack  cup (120 mL) fruit ice.  cup (120 mL) strained fruit juice. Dinner 1 cup (240 mL) broth or consomm.  cup (120 mL) cranberry juice.  cup (120 mL) flavored gelatin (plain or fortified). 1 cup (240 mL) beverage (coffee or tea). Sugar, if desired. Evening Snack 4 oz (120 mL) strained apple juice (vitamin C-fortified).  cup (120 mL) flavored gelatin (plain or fortified). MAKE SURE YOU: Understand these instructions. Will watch your child's condition. Will get help right away if your child is not doing well or gets worse. Document Released: 02/11/2005 Document Revised: 10/14/2012 Document Reviewed: 07/14/2012 [facility] Patient Information 2014 [place]Care, [place]C.  Headache  HOME CARE INSTRUCTIONS  Only take over-the-counter or prescription medicines for pain or discomfort as directed by your health care provider. The use of long-term narcotics is not recommended.  Lie down in a dark, quiet room when you have a migraine.  Keep a journal to find out what may trigger your migraine headaches. For example, write down:  What you eat and  drink.  How much sleep you get.  Any change to your diet or medicines.  Limit alcohol consumption.  Quit smoking if you smoke.  Get 7 9 hours of sleep, or as recommended by your health care provider.  Limit stress.  Keep lights dim if bright lights bother you and make your migraines worse. SEEK IMMEDIATE MEDICAL CARE IF:   Your migraine becomes severe.  You have a fever.  You have a stiff neck.  You have vision loss.  You have muscular weakness or loss of muscle control.  You start losing your balance or have trouble walking.  You feel faint or pass out.  You have severe symptoms that are different from your first symptoms. MAKE SURE YOU:   Understand these instructions.  Will watch your condition.  Will get help right away if you are not doing well or get worse. Document Released: 02/11/2005 Document Revised: 12/02/2012 Document Reviewed: 10/19/2012 [facility] Patient Information 2014 [place], [place].   Emergency Department Resource Guide 1) Find a Doctor and Pay Out of Pocket Although you won't have to find out who is covered by your insurance plan, it is a good idea to ask around and get recommendations. You will then need to call the office and see if the doctor you have chosen will accept you as a new patient and what types of options they offer for patients who are self-pay. Some doctors offer discounts or will set up payment plans for their patients who do not have insurance, but you will need to ask so you aren't surprised when you get to your appointment.  2) Contact Your Local Health Department Not all health departments have doctors that can see patients for sick visits, but many do, so it is worth a call to see if yours does. If you don't know where your local health department is, you can check in your phone book. The CDC also has a tool to help you locate your state's health department, and many state websites also have listings of all of their local health  departments.  3) Find a Walk-in Clinic If your illness is not likely to be very severe or complicated, you may want to try a walk in clinic. These are popping up all over the country in pharmacies, drugstores, and shopping centers. They're usually staffed by nurse practitioners or physician assistants that have been trained to treat common illnesses and complaints. They're usually fairly quick and inexpensive. However, if you have serious medical issues or chronic medical problems, these are probably not your best option.  No Primary Care Doctor: - Call Health Connect at  [phone] - they can help you locate a primary care doctor that  accepts your insurance, provides certain services, etc. - Physician Referral Service- [phone]  Chronic Pain Problems: Organization         Address  Phone   Notes  [facility]  [phone] Patients need to be referred by their primary care doctor.   Medication Assistance: Organization         Address  Phone   Notes  [facility] Medication Assistance Program [address] [place], [place] [address] [phone] --Must be a resident of [facility] -- Must have NO insurance coverage whatsoever (no Medicaid/ Medicare, etc.) -- The pt. MUST have a primary care doctor that directs their care regularly and follows them in the community   [facility]ist  [phone]   [employer]  [phone]    Agencies that provide inexpensive medical care: Organization         Address  Phone   Notes  [facility]  [phone]   [facility]    [phone]   [facility] [address] [place], [place] [address] [phone]   [facility] [address] [place] [place] [phone]   Planned Parenthood    [phone]   [facility]    [phone]   [facility]  [address], [place] Phone:  [phone], Fax:  [phone] Hours of Operation:  9 am - 6 pm, M-F.  Also accepts Medicaid/Medicare and self-pay.  [facility]  [address], [place] Phone: [phone], Fax: [phone]. Hours of Operation:  8:30 am - 5:30 pm, M-F.  Also accepts Medicaid and self-pay.  [facility] [place] [address] [place] Phone: [phone]   [facility] [address] Natasha Bence [place], [place] [phone], Ext. [phone] Mondays & Thursdays: 7-9 AM.  First 15 patients are seen on a first come, first serve basis.    Medicaid-accepting [facility] Providers:  Organization         Address  Phone   Notes  [facility] [address], [place] [phone] Also accepts self-pay patients.  [facility] [address] Laurell Josephs [place], [place]  [phone]   [facility] [address], [place] [phone]   [facility] [address] [place] [phone]   Renaye Rakers [address], [place]   [phone] Only accepts [place] Access [place] patients after they have their name applied to their card.   Self-Pay (no insurance) in [facility]:  Organization         Address  Phone   Notes  Sickle Cell Patients, [facility] [address] [place], [place] [phone]   [facility] [address] [place], [place] [phone]   [facility]  [address], [place] [phone]   [facility]/Dr. Osei-Bonsu  [address], [place] or [address], [place] [phone] Phone number for both [place] and [place] locations is the same.  [facility] [address] [place] [phone]   [facility] [address] [place] or [address] [phone]   [facility] [address] [place] [phone], phone; [phone], fax Sees patients 1st and 3rd Saturday of every month.  Must not qualify for public or private insurance (i.e. Medicaid, Medicare, [place] Health Choice, Veterans' Benefits)  Household income should be no more than 200% of the poverty level The clinic cannot treat you if you are pregnant or think you are pregnant  Sexually transmitted diseases are  not treated at the clinic.    Dental Care: Organization         Address  Phone  Notes  [facility] of [facility] [address] [place], [place] [phone] Accepts children up to age 71 who are enrolled in [place] or [place] Health Choice; pregnant women with a Medicaid card; and children who have applied for Medicaid or [place] Health Choice, but were declined, whose parents can pay a reduced fee at time of service.  [facility] of [facility]  [address], [place] [phone] Accepts children up to age 82 who are enrolled in [place] or [place] Health Choice; pregnant women with a Medicaid card; and children who have applied for Medicaid or [place] Health Choice, but were declined, whose parents can pay a reduced fee at time of service.  [facility]  [address] [place], [place] [phone] Patients are seen by appointment only. Walk-ins are not accepted. [facility] will see patients 46 years of age and older. Monday - Tuesday (8am-5pm) Most Wednesdays (8:30-5pm) $30 per visit, cash only  [facility] Adult Dental Access PRO[facility]  [address], [facility] [phone] Patients are seen by appointment only. Walk-ins are not accepted. [facility] will see patients 64 years of age and older. One Wednesday Evening (Monthly: Volunteer Based).  $30 per visit, cash only  [employer]  [phone] for adults;  Children under age 68, call Graduate Pediatric Dentistry at [phone]. Children aged 82-14, please call [phone] to request a pediatric application.  Dental services are provided in all areas of dental care including fillings, crowns and bridges, complete and partial dentures, implants, gum treatment, root canals, and extractions. Preventive care is also provided. Treatment is provided to both adults and children. Patients are selected via a lottery and there is often a waiting list.   [facility] [address], [place]  [phone] [email or web address]   [facility] [address] [place], [place] [phone], Ext. [phone] Second and Fourth Thursday of each month, opens at 6:30 AM; Clinic ends at 9 AM.  Patients are seen on a first-come first-served basis, and a limited number are seen during each clinic.   [facility]  [address] Ether Griffins [place], [place] [phone]   Eligibility Requirements You must have lived in [place], [place], or [place] counties for at least the last three months.   You cannot be eligible for state or federal sponsored [place], including [employer], [place], or [employer].   You generally cannot be eligible for healthcare insurance through your employer.    How to apply: Eligibility screenings are held every Tuesday and Wednesday afternoon from 1:00 pm until 4:00 pm. You do not need an appointment for the interview!  [facility] [address], [place], [place] [phone]   [facility] Health Department  [phone]   [facility] Health Department  [phone]   [facility] Health Department  [phone]    Behavioral Health Resources in the Community: Intensive Outpatient Programs Organization         Address  Phone  Notes  [facility] [address], [place], [place] [phone]   [facility] Outpatient [address] [place], [place] [phone]   [facility] [address] [place], [place]  [phone]  [facility] [address]  [place], [place] [phone] or [phone]   Substance Abuse Resources Organization         Address  Phone  Notes  [facility]  [phone]   [facility]  [phone]   [facility] [place]  [phone]   Floydene Flock  [phone]   Residential & Outpatient Substance Abuse Program  [phone]   Psychological Services Organization         Address  Phone  Notes  [facility] Behavioral Health  [phone]   [facility]  [phone]   [facility] Mental Health [address] [place] [phone] or [phone]    Mobile Crisis Teams Organization         Address  Phone  Notes  [facility], Mobile Crisis Care Unit  [phone]   [facility]  [address] [place], [place] [phone]   Doristine Locks [address] [place] [phone]    Self-Help/Support Groups Organization         Address  Phone             Notes  [facility] [place] - variety of support groups  336- I7437963 Call for more information  Narcotics Anonymous (NA), [facility] [address], [employer] [place]  2 meetings at this location   Statistician         Address  Phone  Notes  [facility] [address],    [place]  [phone]   [facility]  [address], [place] [address], [place], [place] [phone]   [facility] Treatment Facility [address] [place], [place] [phone] Admissions: 8am-3pm M-F  [facility] [address],    [place], [place] [phone]   [facility] [address] [place] [address] [place], [place], [place] [phone]   [facility] [address],  [place], [place] [phone]   [facility] - Intensive  Outpatient [address] Laurell Josephs [address], [place], [place] [phone]   [facility] ([facility] [address] [place]  [place], [place] [phone] or [phone]   [facility] ([facility]) [address], [place], [place] [phone] Accepts Medicaid  [facility] Rarden [address],  [place] [phone] Substance Abuse/Addiction Treatment   [facility] Organization         Address  Phone  Notes  [facility]  [phone]   Angie Fava, PhD [address] Ervin Knack [place], [place]   [phone] or [phone]   [facility] Behavioral   [address] [place], [place] [phone]   [facility] [address] [place], [place] [phone] Insurance/Medicaid/sponsorship through [facility] and Families [address]                                    [place], [place] [phone] Therapy/tele-psych/case  [facility] [address], [place] [phone]    Dr. Lolly Mustache  [phone]   [facility]  [employer] [facility] Dept. 1) [address] [place] 2) [address] [place] 3)  [address], [place] [phone]  [phone]   [facility] Child Abuse Hotline 3211142585 or [phone] (After Hours)

## 2013-06-09 NOTE — ED Provider Notes (Signed)
Medical screening examination/treatment/procedure(s) were performed by non-physician practitioner and as supervising physician I was immediately available for consultation/collaboration.     Celene KrasJon R Cayle [place], MD 06/09/13 [phone]

## 2014-11-16 ENCOUNTER — Encounter (HOSPITAL_COMMUNITY): Payer: Self-pay

## 2014-11-16 ENCOUNTER — Emergency Department (HOSPITAL_COMMUNITY)
Admission: EM | Admit: 2014-11-16 | Discharge: 2014-11-16 | Disposition: A | Discharge status: Home / Self Care | Payer: Self-pay | Attending: Emergency Medicine | Admitting: Emergency Medicine

## 2014-11-16 ENCOUNTER — Emergency Department (HOSPITAL_COMMUNITY): Payer: Self-pay

## 2014-11-16 DIAGNOSIS — R1032 Left lower quadrant pain: Secondary | ICD-10-CM | POA: Insufficient documentation

## 2014-11-16 LAB — CBC WITH DIFFERENTIAL/PLATELET
Basophils Absolute: 0 10*3/uL (ref 0.0–0.1)
Basophils Relative: 1 %
Eosinophils Absolute: 0.2 10*3/uL (ref 0.0–0.7)
Eosinophils Relative: 4 %
HCT: 41.8 % (ref 39.0–52.0)
Hemoglobin: 14.2 g/dL (ref 13.0–17.0)
Lymphocytes Relative: 31 %
Lymphs Abs: 1.3 10*3/uL (ref 0.7–4.0)
MCH: 29 pg (ref 26.0–34.0)
MCHC: 34 g/dL (ref 30.0–36.0)
MCV: 85.5 fL (ref 78.0–100.0)
Monocytes Absolute: 0.4 10*3/uL (ref 0.1–1.0)
Monocytes Relative: 9 %
Neutro Abs: 2.4 10*3/uL (ref 1.7–7.7)
Neutrophils Relative %: 55 %
Platelets: 219 10*3/uL (ref 150–400)
RBC: 4.89 MIL/uL (ref 4.22–5.81)
RDW: 14.2 % (ref 11.5–15.5)
WBC: 4.4 10*3/uL (ref 4.0–10.5)

## 2014-11-16 LAB — URINALYSIS, ROUTINE W REFLEX MICROSCOPIC
Bilirubin Urine: NEGATIVE
Glucose, UA: NEGATIVE mg/dL
Hgb urine dipstick: NEGATIVE
Ketones, ur: NEGATIVE mg/dL
Leukocytes, UA: NEGATIVE
Nitrite: NEGATIVE
Protein, ur: NEGATIVE mg/dL
Specific Gravity, Urine: 1.017 (ref 1.005–1.030)
Urobilinogen, UA: 0.2 mg/dL (ref 0.0–1.0)
pH: 7.5 (ref 5.0–8.0)

## 2014-11-16 LAB — COMPREHENSIVE METABOLIC PANEL
ALT: 14 U/L — ABNORMAL LOW (ref 17–63)
AST: 20 U/L (ref 15–41)
Albumin: 3.8 g/dL (ref 3.5–5.0)
Alkaline Phosphatase: 62 U/L (ref 38–126)
Anion gap: 4 — ABNORMAL LOW (ref 5–15)
BUN: 11 mg/dL (ref 6–20)
CO2: 28 mmol/L (ref 22–32)
Calcium: 9.3 mg/dL (ref 8.9–10.3)
Chloride: 107 mmol/L (ref 101–111)
Creatinine, Ser: 0.98 mg/dL (ref 0.61–1.24)
GFR calc Af Amer: >60 mL/min (ref >60)
GFR calc non Af Amer: >60 mL/min (ref >60)
Glucose, Bld: 78 mg/dL (ref 65–99)
Potassium: 4.2 mmol/L (ref 3.5–5.1)
Sodium: 139 mmol/L (ref 135–145)
Total Bilirubin: 0.4 mg/dL (ref 0.3–1.2)
Total Protein: 6.4 g/dL — ABNORMAL LOW (ref 6.5–8.1)

## 2014-11-16 LAB — LIPASE, BLOOD: Lipase: 53 U/L — ABNORMAL HIGH (ref 22–51)

## 2014-11-16 MED ORDER — SODIUM CHLORIDE 0.9 % IV BOLUS (SEPSIS)
1000.0000 mL | Freq: Once | INTRAVENOUS | Status: AC
  Administered 2014-11-16: 1000 mL via INTRAVENOUS

## 2014-11-16 MED ORDER — PROMETHAZINE HCL 25 MG PO TABS: 25.0000 mg | ORAL_TABLET | Freq: Three times a day (TID) | ORAL | Status: DC | PRN

## 2014-11-16 MED ORDER — IOHEXOL 300 MG/ML  SOLN
100.0000 mL | Freq: Once | INTRAMUSCULAR | Status: AC | PRN
  Administered 2014-11-16: 100 mL via INTRAVENOUS

## 2014-11-16 MED ORDER — KETOROLAC TROMETHAMINE 30 MG/ML IJ SOLN
30.0000 mg | Freq: Once | INTRAMUSCULAR | Status: AC
  Administered 2014-11-16: 30 mg via INTRAVENOUS
  Filled 2014-11-16: qty 1

## 2014-11-16 MED ORDER — MORPHINE SULFATE (PF) 4 MG/ML IV SOLN
4.0000 mg | Freq: Once | INTRAVENOUS | Status: AC
  Administered 2014-11-16: 4 mg via INTRAVENOUS
  Filled 2014-11-16: qty 1

## 2014-11-16 MED ORDER — IOHEXOL 300 MG/ML  SOLN
25.0000 mL | INTRAMUSCULAR | Status: AC
  Administered 2014-11-16: 25 mL via ORAL

## 2014-11-16 MED ORDER — IOHEXOL 300 MG/ML  SOLN: 25.0000 mL | INTRAMUSCULAR | Status: DC

## 2014-11-16 MED ORDER — ONDANSETRON HCL 4 MG/2ML IJ SOLN
4.0000 mg | Freq: Once | INTRAMUSCULAR | Status: AC
  Administered 2014-11-16: 4 mg via INTRAVENOUS
  Filled 2014-11-16: qty 2

## 2014-11-16 MED ORDER — HYDROCODONE-ACETAMINOPHEN 5-325 MG PO TABS: 1.0000 | ORAL_TABLET | Freq: Four times a day (QID) | ORAL | Status: DC | PRN

## 2014-11-16 NOTE — ED Notes (Signed)
Pt here for abd pain, onset several days ago, no vomiting but nauseated noted, reports lack of appetite, reports hx of same and seen here and at [facility] and only given meds for nausea never anything for pain,

## 2014-11-16 NOTE — ED Notes (Signed)
Pt aware of urine sample needed, pt unable to go at this time. 

## 2014-11-16 NOTE — ED Provider Notes (Signed)
CSN: [id number]     Arrival date & time 11/16/14  0604 History   First MD Initiated Contact with Patient 11/16/14 [phone]     Chief Complaint  Patient presents with  . Abdominal Pain     (Consider location/radiation/quality/duration/timing/severity/associated sxs/prior Treatment) HPI Patient presents to the emergency department with abdominal pain that started yesterday.  The patient states that he has had this type of abdominal pain in the past and was seen in the emergency department.  The patient denies any chronic medical conditions.  He has had no abdominal surgeries.  Patient denies chest pain, shortness of breath, nausea, vomiting, diarrhea, weakness, dizziness, headache, blurred vision, back pain, dysuria, incontinence, hematemesis, bloody stool, anorexia, near syncope or syncope.  The patient states that he did not take any medications other than ibuprofen prior to arrival for his symptoms.  He states that he got minimal relief from this.  The patient states that he has not seen anyone other than the emergency department for his abdominal pain Past Medical History  Diagnosis Date  . No significant past medical history    History reviewed. No pertinent past surgical history. Family History  Problem Relation Age of Onset  . Ovarian cancer Mother    Social History  Substance Use Topics  . Smoking status: Never Smoker   . Smokeless tobacco: None  . Alcohol Use: No    Review of Systems   All other systems negative except as documented in the HPI. All pertinent positives and negatives as reviewed in the HPI. Allergies  Pineapple  Home Medications   Prior to Admission medications   Medication Sig Start Date End Date Taking? Authorizing Provider  acetaminophen (TYLENOL) 500 MG tablet Take 500 mg by mouth every 6 (six) hours as needed for pain.   Yes Historical Provider, MD  ibuprofen (ADVIL,MOTRIN) 400 MG tablet Take 400 mg by mouth every 6 (six) hours as needed for mild pain.    Yes Historical Provider, MD   BP 108/64 mmHg  Pulse 52  Temp(Src) 98 F (36.7 C) (Oral)  Resp 18  Ht  (1.753 m)  Wt 147 lb (66.679 kg)  BMI 21.70 kg/m2  SpO2 99% Physical Exam  Constitutional: He is oriented to person, place, and time. He appears well-developed and well-nourished. No distress.  HENT:  Head: Normocephalic and atraumatic.  Mouth/Throat: Oropharynx is clear and moist.  Eyes: Pupils are equal, round, and reactive to light.  Neck: Normal range of motion. Neck supple.  Cardiovascular: Normal rate, regular rhythm and normal heart sounds.  Exam reveals no gallop and no friction rub.   No murmur heard. Pulmonary/Chest: Effort normal and breath sounds normal. No respiratory distress.  Abdominal: Soft. Normal appearance and bowel sounds are normal. He exhibits no distension. There is tenderness. There is no rebound, no guarding and no CVA tenderness. No hernia.    Musculoskeletal: He exhibits no edema.  Neurological: He is alert and oriented to person, place, and time. He exhibits normal muscle tone. Coordination normal.  Skin: Skin is warm and dry. No rash noted. No erythema.  Psychiatric: He has a normal mood and affect. His behavior is normal.  Nursing note and vitals reviewed.   ED Course  Procedures (including critical care time) Labs Review Labs Reviewed  COMPREHENSIVE METABOLIC PANEL - Abnormal; Notable for the following:    Total Protein 6.4 (*)    ALT 14 (*)    Anion gap 4 (*)    All other components within normal  limits  LIPASE, BLOOD - Abnormal; Notable for the following:    Lipase 53 (*)    All other components within normal limits  URINALYSIS, ROUTINE W REFLEX MICROSCOPIC (NOT AT [facility])  CBC WITH DIFFERENTIAL/PLATELET    Imaging Review Ct Abdomen Pelvis W Contrast  11/16/2014   CLINICAL DATA:  Abdominal pain  EXAM: CT ABDOMEN AND PELVIS WITH CONTRAST  TECHNIQUE: Multidetector CT imaging of the abdomen and pelvis was performed using the standard  protocol following bolus administration of intravenous contrast.  CONTRAST:  OMNIPAQUE IOHEXOL 300 MG/ML  SOLN  COMPARISON:  None.  FINDINGS: Lower chest:  No acute findings.  Hepatobiliary: No masses or other significant abnormality.  Pancreas: No mass, inflammatory changes, or other significant abnormality.  Spleen: Within normal limits in size and appearance.  Adrenals/Urinary Tract: No masses identified. No evidence of hydronephrosis.  Stomach/Bowel: No evidence of obstruction, inflammatory process, or abnormal fluid collections. Appendix is visualized and is normal.  Vascular/Lymphatic: No pathologically enlarged lymph nodes. No evidence of abdominal aortic aneursym.  Reproductive: No mass or other significant abnormality.  Other: No free fluid.  Musculoskeletal:  No suspicious bone lesions identified.  IMPRESSION: Unremarkable abdominal CT.   Electronically Signed   By: Charlett Nose M.D.   On: 11/16/2014 09:41   I have personally reviewed and evaluated these images and lab results as part of my medical decision-making.  I rechecked the patient.  2.  Patient is advised of the CT scan results and laboratory testing.  Advised him that he will need to follow up with GI for further evaluation and care of his abdominal discomfort.  Patient is advised return here for any worsening in his condition.  Patient agrees to the plan and all questions were answered.  At this point, this is undifferentiated abdominal pain that has a chronic component as he has had previous episodes in the past.  Charlestine Night, PA-C 11/16/14 1009  Benjiman Core, MD 11/18/14 1444

## 2014-11-16 NOTE — Discharge Instructions (Signed)
Your testing here today did not show any significant abnormalities that would give [place] the answer for your abdominal pain.  Therefore follow-up with a GI specialist be necessary to further delineate what is causing your pain.  Return here for any worsening in your condition. increase your fluid intake, rest as much as possible

## 2014-11-16 NOTE — ED Notes (Signed)
MD at bedside. 

## 2015-03-29 ENCOUNTER — Emergency Department (HOSPITAL_COMMUNITY)
Admission: EM | Admit: 2015-03-29 | Discharge: 2015-03-30 | Disposition: A | Discharge status: Other Acute Inpatient Hospital | Payer: Self-pay | Attending: Emergency Medicine | Admitting: Emergency Medicine

## 2015-03-29 ENCOUNTER — Encounter (HOSPITAL_COMMUNITY): Payer: Self-pay

## 2015-03-29 DIAGNOSIS — F1210 Cannabis abuse, uncomplicated: Secondary | ICD-10-CM | POA: Insufficient documentation

## 2015-03-29 DIAGNOSIS — R630 Anorexia: Secondary | ICD-10-CM | POA: Insufficient documentation

## 2015-03-29 DIAGNOSIS — F329 Major depressive disorder, single episode, unspecified: Secondary | ICD-10-CM | POA: Insufficient documentation

## 2015-03-29 DIAGNOSIS — F32A Depression, unspecified: Secondary | ICD-10-CM

## 2015-03-29 DIAGNOSIS — G479 Sleep disorder, unspecified: Secondary | ICD-10-CM | POA: Insufficient documentation

## 2015-03-29 DIAGNOSIS — F419 Anxiety disorder, unspecified: Secondary | ICD-10-CM | POA: Insufficient documentation

## 2015-03-29 LAB — CBC
HEMATOCRIT: 43.9 % (ref 39.0–52.0)
HEMOGLOBIN: 15 g/dL (ref 13.0–17.0)
MCH: 29.8 pg (ref 26.0–34.0)
MCHC: 34.2 g/dL (ref 30.0–36.0)
MCV: 87.1 fL (ref 78.0–100.0)
PLATELETS: 247 10*3/uL (ref 150–400)
RBC: 5.04 MIL/uL (ref 4.22–5.81)
RDW: 14.1 % (ref 11.5–15.5)
WBC: 6.1 10*3/uL (ref 4.0–10.5)

## 2015-03-29 LAB — COMPREHENSIVE METABOLIC PANEL
ALBUMIN: 4.2 g/dL (ref 3.5–5.0)
ALK PHOS: 73 U/L (ref 38–126)
ALT: 12 U/L — AB (ref 17–63)
AST: 20 U/L (ref 15–41)
Anion gap: 10 (ref 5–15)
BUN: 14 mg/dL (ref 6–20)
CALCIUM: 9.9 mg/dL (ref 8.9–10.3)
CHLORIDE: 102 mmol/L (ref 101–111)
CO2: 30 mmol/L (ref 22–32)
CREATININE: 1.05 mg/dL (ref 0.61–1.24)
GFR calc non Af Amer: >60 mL/min (ref >60)
GLUCOSE: 88 mg/dL (ref 65–99)
Potassium: 4.3 mmol/L (ref 3.5–5.1)
SODIUM: 142 mmol/L (ref 135–145)
Total Bilirubin: 0.1 mg/dL — ABNORMAL LOW (ref 0.3–1.2)
Total Protein: 7 g/dL (ref 6.5–8.1)

## 2015-03-29 LAB — SALICYLATE LEVEL

## 2015-03-29 LAB — ETHANOL: Alcohol, Ethyl (B): <5 mg/dL (ref <5)

## 2015-03-29 LAB — ACETAMINOPHEN LEVEL: Acetaminophen (Tylenol), Serum: <10 ug/mL — ABNORMAL LOW (ref 10–30)

## 2015-03-29 MED ORDER — NICOTINE 21 MG/24HR TD PT24: 21.0000 mg | MEDICATED_PATCH | Freq: Every day | TRANSDERMAL | Status: DC

## 2015-03-29 MED ORDER — ZOLPIDEM TARTRATE 5 MG PO TABS: 5.0000 mg | ORAL_TABLET | Freq: Every evening | ORAL | Status: DC | PRN

## 2015-03-29 MED ORDER — IBUPROFEN 400 MG PO TABS: 600.0000 mg | ORAL_TABLET | Freq: Three times a day (TID) | ORAL | Status: DC | PRN

## 2015-03-29 MED ORDER — ONDANSETRON HCL 4 MG PO TABS: 4.0000 mg | ORAL_TABLET | Freq: Three times a day (TID) | ORAL | Status: DC | PRN

## 2015-03-29 NOTE — ED Notes (Signed)
Security wanded patient while in Triage.

## 2015-03-29 NOTE — ED Notes (Signed)
Staffing called for a sitter. No one available at this time to sit and may have staff at 0700

## 2015-03-29 NOTE — ED Notes (Signed)
Security called to come wand pt  

## 2015-03-29 NOTE — ED Notes (Signed)
Pt states for the past several weeks he has been having thoughts of wanting to hurt himself but doesn't have a plan. Has difficulty talking about it.

## 2015-03-29 NOTE — ED Provider Notes (Addendum)
CSN: [id number]     Arrival date & time 03/29/15  2229 History   First MD Initiated Contact with Patient 03/29/15 2312     Chief Complaint  Patient presents with  . Suicidal     (Consider location/radiation/quality/duration/timing/severity/associated sxs/prior Treatment) HPI Comments: Depresses and sad  The history is provided by the patient.    Past Medical History  Diagnosis Date  . No significant past medical history    History reviewed. No pertinent past surgical history. Family History  Problem Relation Age of Onset  . Ovarian cancer Mother    Social History  Substance Use Topics  . Smoking status: Never Smoker   . Smokeless tobacco: None  . Alcohol Use: Yes     Comment: occasionally    Review of Systems  Constitutional: Positive for appetite change.  Neurological: Negative for dizziness.  Psychiatric/Behavioral: Positive for suicidal ideas and sleep disturbance.  All other systems reviewed and are negative.     Allergies  Pineapple  Home Medications   Prior to Admission medications   Medication Sig Start Date End Date Taking? Authorizing Provider  acetaminophen (TYLENOL) 500 MG tablet Take 500 mg by mouth every 6 (six) hours as needed for pain.   Yes Historical Provider, MD  ibuprofen (ADVIL,MOTRIN) 400 MG tablet Take 400 mg by mouth every 6 (six) hours as needed for mild pain.   Yes Historical Provider, MD   BP 110/65 mmHg  Pulse 79  Temp(Src) 98.2 F (36.8 C) (Oral)  Resp 16  Ht  (1.753 m)  Wt 70.308 kg  BMI 22.88 kg/m2  SpO2 98% Physical Exam  Constitutional: He is oriented to person, place, and time. He appears well-developed and well-nourished.  HENT:  Head: Normocephalic.  Eyes: Pupils are equal, round, and reactive to light.  Neck: Normal range of motion.  Cardiovascular: Normal rate and regular rhythm.   Neurological: He is alert and oriented to person, place, and time.  Psychiatric: His speech is normal and behavior is normal.  Judgment normal. His mood appears anxious. Cognition and memory are normal. He exhibits a depressed mood. He expresses suicidal ideation. He expresses no suicidal plans.  Nursing note and vitals reviewed.   ED Course  Procedures (including critical care time) Labs Review Labs Reviewed  COMPREHENSIVE METABOLIC PANEL - Abnormal; Notable for the following:    ALT 12 (*)    Total Bilirubin 0.1 (*)    All other components within normal limits  ACETAMINOPHEN LEVEL - Abnormal; Notable for the following:    Acetaminophen (Tylenol), Serum <10 (*)    All other components within normal limits  URINE RAPID DRUG SCREEN, HOSP PERFORMED - Abnormal; Notable for the following:    Tetrahydrocannabinol POSITIVE (*)    All other components within normal limits  ETHANOL  SALICYLATE LEVEL  CBC    Imaging Review No results found. I have personally reviewed and evaluated these images and lab results as part of my medical decision-making.   EKG Interpretation None     patient has been assessed by TTS he does meet criteria for inpatient psychiatric care.  Septic for transfer to our behavioral health unit  MDM   Final diagnoses:  Depression         Earley Favor, NP 03/30/15 0201  Earley Favor, NP 03/30/15 4098  Nelva Nay, MD 04/02/15 1191  Earley Favor, NP 05/22/15 2009  Nelva Nay, MD 05/24/15 [phone]

## 2015-03-30 ENCOUNTER — Inpatient Hospital Stay (HOSPITAL_COMMUNITY)
Admission: EM | Admit: 2015-03-30 | Discharge: 2015-04-02 | DRG: 885 | Disposition: A | Discharge status: Home / Self Care | Payer: Federal, State, Local not specified - Other | Source: Intra-hospital | Attending: Psychiatry | Admitting: Psychiatry

## 2015-03-30 ENCOUNTER — Encounter (HOSPITAL_COMMUNITY): Payer: Self-pay

## 2015-03-30 DIAGNOSIS — F332 Major depressive disorder, recurrent severe without psychotic features: Principal | ICD-10-CM | POA: Diagnosis present

## 2015-03-30 DIAGNOSIS — R45851 Suicidal ideations: Secondary | ICD-10-CM | POA: Diagnosis present

## 2015-03-30 DIAGNOSIS — F1220 Cannabis dependence, uncomplicated: Secondary | ICD-10-CM | POA: Diagnosis not present

## 2015-03-30 DIAGNOSIS — F329 Major depressive disorder, single episode, unspecified: Secondary | ICD-10-CM | POA: Diagnosis present

## 2015-03-30 LAB — RAPID URINE DRUG SCREEN, HOSP PERFORMED
Amphetamines: NOT DETECTED
BARBITURATES: NOT DETECTED
Benzodiazepines: NOT DETECTED
Cocaine: NOT DETECTED
Opiates: NOT DETECTED
Tetrahydrocannabinol: POSITIVE — AB

## 2015-03-30 MED ORDER — TRAZODONE HCL 50 MG PO TABS
50.0000 mg | ORAL_TABLET | Freq: Every evening | ORAL | Status: DC | PRN
  Administered 2015-03-30 – 2015-04-01 (×3): 50 mg via ORAL
  Filled 2015-03-30 (×2): qty 1
  Filled 2015-03-30 (×2): qty 14
  Filled 2015-03-30 (×6): qty 1

## 2015-03-30 MED ORDER — CITALOPRAM HYDROBROMIDE 20 MG PO TABS
20.0000 mg | ORAL_TABLET | Freq: Every day | ORAL | Status: DC
  Administered 2015-03-30 – 2015-04-02 (×4): 20 mg via ORAL
  Filled 2015-03-30: qty 1
  Filled 2015-03-30: qty 7
  Filled 2015-03-30 (×5): qty 1

## 2015-03-30 MED ORDER — ALUM & MAG HYDROXIDE-SIMETH 200-200-20 MG/5ML PO SUSP: 30.0000 mL | ORAL | Status: DC | PRN

## 2015-03-30 MED ORDER — MAGNESIUM HYDROXIDE 400 MG/5ML PO SUSP: 30.0000 mL | Freq: Every day | ORAL | Status: DC | PRN

## 2015-03-30 MED ORDER — ACETAMINOPHEN 325 MG PO TABS: 650.0000 mg | ORAL_TABLET | Freq: Four times a day (QID) | ORAL | Status: DC | PRN

## 2015-03-30 MED ORDER — ENSURE ENLIVE PO LIQD
237.0000 mL | Freq: Two times a day (BID) | ORAL | Status: DC
  Administered 2015-03-30 – 2015-04-01 (×4): 237 mL via ORAL

## 2015-03-30 MED ORDER — HYDROXYZINE HCL 25 MG PO TABS
25.0000 mg | ORAL_TABLET | Freq: Four times a day (QID) | ORAL | Status: DC | PRN
  Filled 2015-03-30: qty 10

## 2015-03-30 NOTE — Progress Notes (Signed)
D:  Patient's self inventory sheet, patient has poor sleep.  Fair appetite, normal energy level, good concentration.  Rated depression, hopeless and anxiety 10.  Denied withdrawals.  Denied SI thoughts.  Denied physical problems.  Denied pain.  Goal is to discharge, just needs medications and then go home.  Discharge plans to return home. A:  Medications administered per MD orders.  Emotional support and encouragement given patient. R:  Denied SI and HI, contracts for safety.  Denied A/V hallucinations.  Safety maintained with 15 minute checks.

## 2015-03-30 NOTE — [facility] Suicide Risk Assessment (Signed)
[facility] INPATIENT:  Family/Significant Other Suicide Prevention Education  Suicide Prevention Education:  Patient Refusal for Family/Significant Other Suicide Prevention Education: The patient Andre Miller has refused to provide written consent for family/significant other to be provided Family/Significant Other Suicide Prevention Education during admission and/or prior to discharge.  Physician notified. SPE reviewed with patient and brochure provided. Patient encouraged to return to hospital if having suicidal thoughts, patient verbalized his/her understanding and has no further questions at this time.   Keiera Strathman, [place] 03/30/2015, 1:02 PM

## 2015-03-30 NOTE — Progress Notes (Signed)
PATIENT SIGNED 72 HR REQUEST FOR DISCHARGE ON 03/30/2015 AT 0830.

## 2015-03-30 NOTE — Tx Team (Signed)
Initial Interdisciplinary Treatment Plan   PATIENT STRESSORS: Pt unable to identify any stressors   PATIENT STRENGTHS: Average or above average intelligence General fund of knowledge Motivation for treatment/growth Supportive family/friends   PROBLEM LIST: Problem List/Patient Goals Date to be addressed Date deferred Reason deferred Estimated date of resolution  "Depression" 2/2     "Anxiety" 2/2     "Hopelessness" 2/2     Increased risk for SI 2/2                                    DISCHARGE CRITERIA:  Improved stabilization in mood, thinking, and/or behavior Motivation to continue treatment in a less acute level of care Reduction of life-threatening or endangering symptoms to within safe limits Verbal commitment to aftercare and medication compliance  PRELIMINARY DISCHARGE PLAN: Attend PHP/IOP  PATIENT/FAMIILY INVOLVEMENT: This treatment plan has been presented to and reviewed with the patient, Andre Miller.  The patient and family have been given the opportunity to ask questions and make suggestions.  Anoushka Divito A 03/30/2015, 6:25 AM

## 2015-03-30 NOTE — Progress Notes (Signed)
D: Pt was in bed in his room upon initial approach.  He presents with depressed affect and mood.  Pt reports his goal today was "to get the hell out of here."  Pt reports "this environment is making me feel worse."  He reports he had a good visit with his wife tonight.  He has stayed in his room for the majority of the night.  He has not been interacting with peers and he did not attend evening group.  Pt denies SI/HI, denies hallucinations, denies pain.    A: Introduced self to pt.  Actively listened to pt and offered support and encouragement.  Medication administered per order.  Medication education provided.    R: Pt is compliant with scheduled medication.  He verbally contracts for safety and reports that he will inform staff of needs and concerns.  Will continue to monitor and assess.

## 2015-03-30 NOTE — Progress Notes (Signed)
NUTRITION ASSESSMENT  Pt identified as at risk on the Malnutrition Screen Tool  INTERVENTION: 1. Supplements: Ensure Enlive po BID, each supplement provides 350 kcal and 20 grams of protein  NUTRITION DIAGNOSIS: Unintentional weight loss related to sub-optimal intake as evidenced by pt report.   Goal: Pt to meet >/= 90% of their estimated nutrition needs.  Monitor:  PO intake  Assessment:  Pt admitted with depression and anxiety which has caused decreased appetite. Pt reports losing 10 lb over a few weeks per H&P. This is 7% wt loss x 2-4 weeks which is significant for time frame. Pt would benefit from nutritional supplements. RD to order Ensure.  Height: Ht Readings from Last 1 Encounters:  03/30/15  (1.753 m)    Weight: Wt Readings from Last 1 Encounters:  03/30/15 141 lb (63.957 kg)    Weight Hx: Wt Readings from Last 10 Encounters:  03/30/15 141 lb (63.957 kg)  03/29/15 155 lb (70.308 kg)  11/16/14 147 lb (66.679 kg)    BMI:  Body mass index is 20.81 kg/(m^2). Pt meets criteria for normal range based on current BMI.  Estimated Nutritional Needs: Kcal: 25-30 kcal/kg Protein: > 1 gram protein/kg Fluid: 1 ml/kcal  Diet Order: Diet regular Room service appropriate?: Yes; Fluid consistency:: Thin Pt is also offered choice of unit snacks mid-morning and mid-afternoon.  Pt is eating as desired.   Lab results and medications reviewed.   Tilda Franco, MS, RD, LDN Pager: [phone] After Hours Pager: [phone]

## 2015-03-30 NOTE — BH Assessment (Addendum)
Tele Assessment Note   Andre Miller is an 40 y.o.married African-American male who came into the [facility] accompanied by his wife c/o SI.  Pt denies HI, SHI and AVH. Pt sts that he came in today due to a "combination of things": believing that it is not fair "to drag my wife through all this" (depression), and "my job."  Pt sts that he has felt these symptoms (SI, lack of motivation, irritable, sleep and eating disturbances) once before about 1 to 1 1/2 years ago but he sts he did not tell anyone.  Pt sts that eventually "things got better on their own." Pt sts that over the last few weeks, he has had SI and had thoughts of OD'ing on Tylenol PM which they keep in their home. Pt also sts that his symptoms have been making him "less productive" at work.  Pt sts that he has been working at this job [employer]) for a year and just was made fulltime.  Pt sts that "sometimes I just want to walk out because I don't want to be around people." Pt sts that in the morning he has been having trouble making himself get out of bed and get in a state of mind to go to work. Pt sts that he has been neglecting his hygiene although typically, he washes his hands excessively. Pt sts that "nothing new has happened recently" to provide stress or anxiety.  Pt sts he has no hx of legal issues and no current legal issues. Pt sts he has had his tolerance for interactions with others decrease recently, mostly he sts in traffic, but has no hx of aggressive acts.  Pt sts that when he was in school he did often get suspended for fighting but, "that was a long time ago." Pt sts that he smokes marijuana daily (about 2-3 blunts) and occasionally drinks alcohol though not to excess.  Pt's BAL was <5 tonight and he tested positive for THC but nothing else in his UDS. Pt sts he has no access to firearms. Symptoms of depression include deep sadness, fatigue, excessive guilt, decreased self esteem, tearfulness & crying spells, self  isolation, lack of motivation for activities and pleasure, irritability, negative outlook, difficulty thinking & concentrating, feeling helpless and hopeless, sleep and eating disturbances. Pt sts he has been sleeping only 2-3 hours per night and has had decreased appetite resulting in a weight loss of about 10 pounds over the last few weeks. Pt denied most symptoms of anxiety but stated that he was feeling anxious and nervous during the assessment.   Pt sts he lives with his wife, Andre Miller, who he gave permission to stay for the assessment. Pt sts he completed the 11th grade in school before stopping.  Pt sts he is employed at a "good job" that he has had for about 1 year and was recently made full-time.  Pt sts that he has family that is generally supportive of him but he has not shared his symptoms with most of them.  Pt sts he only told his wife tonight about his SI. Pt sts he has never been hospitalized for MH reasons and has never had OPT.  Pt sts he is not prescribed any MH medications and has never seen a therapist. Pt denied any hx of abuse including physical, emotional/verbal or sexual. Pt sts that there is a family hx of anxiety in several male relatives.   Pt was dressed in scrubs and sitting on his hospital bed.  Pt was alert, cooperative and pleasant. Pt kept good eye contact, spoke in a clear tone and normal pace. Pt moved in a normal manner when moving. Pt's thought process was coherent and relevant and judgement was impaired.  Pt's mood was depressed and anxious and his blunted affect was congruent.  Pt was oriented x 4, to person, place, time and situation.   Diagnosis: 311 Unspecified Depressive Disorder; 300.00 Unspecified Anxiety Disorder; 304.30 Cannabis Use Disorder, Severe; 303.90 Alcohol Use Disorder, moderate.  Past Medical History:  Past Medical History  Diagnosis Date  . No significant past medical history     History reviewed. No pertinent past surgical history.  Family  History:  Family History  Problem Relation Age of Onset  . Ovarian cancer Mother     Social History:  reports that he has never smoked. He does not have any smokeless tobacco history on file. He reports that he drinks alcohol. He reports that he does not use illicit drugs.  Additional Social History:  Alcohol / Drug Use Prescriptions: See PTA list History of alcohol / drug use?: Yes Substance #1 Name of Substance 1: Alcohol 1 - Age of First Use: 17 1 - Amount (size/oz): 2-3 shot of liquor 1 - Frequency: "every few weeks" 1 - Duration: ongoing 1 - Last Use / Amount: 1 week ago Substance #2 Name of Substance 2: Marijuana 2 - Age of First Use: 15 2 - Amount (size/oz): 2-3 blunts  2 - Frequency: daily 2 - Duration: ongoing 2 - Last Use / Amount: today  CIWA: CIWA-Ar BP: 121/69 mmHg Pulse Rate: 75 COWS:    PATIENT STRENGTHS: (choose at least two) Average or above average intelligence Capable of independent living Communication skills Supportive family/friends  Allergies:  Allergies  Allergen Reactions  . Pineapple     Childhood reaction    Home Medications:  (Not in a hospital admission)  OB/GYN Status:  No LMP for male patient.  General Assessment Data Location of Assessment: [facility] ED TTS Assessment: In system Is this a Tele or Face-to-Face Assessment?: Tele Assessment Is this an Initial Assessment or a Re-assessment for this encounter?: Initial Assessment Marital status: Married Gough name: na Is patient pregnant?: No Pregnancy Status: No Living Arrangements: Spouse/significant other Can pt return to current living arrangement?: Yes Admission Status: Voluntary Is patient capable of signing voluntary admission?: Yes Referral Source: Self/Family/Friend Insurance type: none  Medical Screening Exam [facility] Walk-in ONLY) Medical Exam completed: Yes  Crisis Care Plan Living Arrangements: Spouse/significant other Name of Psychiatrist: none Name of Therapist:  none  Education Status Is patient currently in school?: No Current Grade: na Highest grade of school patient has completed: 65 Name of school: na Contact person: na  Risk to self with the past 6 months Suicidal Ideation: Yes-Currently Present Has patient been a risk to self within the past 6 months prior to admission? : Yes Suicidal Intent: Yes-Currently Present Has patient had any suicidal intent within the past 6 months prior to admission? : Yes Is patient at risk for suicide?: Yes Suicidal Plan?: Yes-Currently Present Has patient had any suicidal plan within the past 6 months prior to admission? : Yes Specify Current Suicidal Plan: plan to take OTC sleeping pills- Tylenol PM Access to Means: Yes What has been your use of drugs/alcohol within the last 12 months?: daily Previous Attempts/Gestures: No How many times?: 0 Triggers for Past Attempts: Unpredictable Intentional Self Injurious Behavior: None Family Suicide History: No (sister with anxiety; cousin w anxiety) Recent  stressful life event(s):  ("nothing new") Persecutory voices/beliefs?: Yes Depression: Yes Depression Symptoms: Tearfulness, Insomnia, Isolating, Fatigue, Guilt, Loss of interest in usual pleasures, Feeling worthless/self pity, Feeling angry/irritable Substance abuse history and/or treatment for substance abuse?: Yes Suicide prevention information given to non-admitted patients: Not applicable  Risk to Others within the past 6 months Homicidal Ideation: No (denies) Does patient have any lifetime risk of violence toward others beyond the six months prior to admission? : Yes (comment) (school fights & suspensions) Thoughts of Harm to Others: Yes-Currently Present (when triggered in anger) Comment - Thoughts of Harm to Others: usually triggerd in traffic and at work Current Homicidal Intent: No Current Homicidal Plan: No Access to Homicidal Means: No (denies) Identified Victim: no one in particular History  of harm to others?: Yes (school fights) Assessment of Violence: In distant past Does patient have access to weapons?: No (denies) Criminal Charges Pending?: No Does patient have a court date: No Is patient on probation?: No  Psychosis Hallucinations: None noted (denies) Delusions: None noted  Mental Status Report Appearance/Hygiene: Disheveled, In scrubs Eye Contact: Fair Motor Activity: Freedom of movement, Unremarkable Speech: Logical/coherent, Soft, Slow Level of Consciousness: Quiet/awake Mood: Depressed, Anxious, Pleasant Affect: Anxious, Depressed, Blunted Anxiety Level: Moderate Thought Processes: Coherent, Relevant Judgement: Impaired Orientation: Person, Place, Time, Situation Obsessive Compulsive Thoughts/Behaviors: Moderate (excesive hand washing)  Cognitive Functioning Concentration: Fair Memory: Recent Intact, Remote Intact IQ: Average Insight: Fair Impulse Control: Fair Appetite: Poor Weight Loss: 10 Weight Gain: 0 Sleep: Decreased Total Hours of Sleep: 3 (2-3 hours) Vegetative Symptoms: Decreased grooming, Staying in bed  ADLScreening [facility] Assessment Services) Patient's cognitive ability adequate to safely complete daily activities?: Yes Patient able to express need for assistance with ADLs?: Yes Independently performs ADLs?: Yes (appropriate for developmental age)  Prior Inpatient Therapy Prior Inpatient Therapy: No Prior Therapy Dates: na Prior Therapy Facilty/Provider(s): na Reason for Treatment: na  Prior Outpatient Therapy Prior Outpatient Therapy: No Prior Therapy Dates: na Prior Therapy Facilty/Provider(s): na Reason for Treatment: na Does patient have an ACCT team?: No Does patient have Intensive In-House Services?  : No Does patient have [facility] services? : No Does patient have P4CC services?: No  ADL Screening (condition at time of admission) Patient's cognitive ability adequate to safely complete daily activities?: Yes Patient  able to express need for assistance with ADLs?: Yes Independently performs ADLs?: Yes (appropriate for developmental age)       Abuse/Neglect Assessment (Assessment to be complete while patient is alone) Physical Abuse: Denies Verbal Abuse: Yes, past (Comment) (as an adult) Sexual Abuse: Denies Exploitation of patient/patient's resources: Denies Self-Neglect: Denies     Merchant navy officer (For Healthcare) Does patient have an advance directive?: No Would patient like information on creating an advanced directive?: No - patient declined information    Additional Information 1:1 In Past 12 Months?: No CIRT Risk: No Elopement Risk: No Does patient have medical clearance?: Yes     Disposition:  Disposition Initial Assessment Completed for this Encounter: Yes Disposition of Patient: Other dispositions (Pending review w [facility] Extender) Other disposition(s): Other (Comment)  Per Donell Sievert, PA at [facility]: Pt meets IP criteria.  Recommend IP tx. Per Clint Bolder, [facility] at [facility]: Accepted to [facility], 400-1, Dr. Jama Flavors. attending  Spoke to Earley Favor, NP at [facility]: Advised of recommendation.  She voiced agreement.   Beryle Flock, MS, CRC, [facility] Triage Specialist [facility] T 03/30/2015 12:51 AM

## 2015-03-30 NOTE — [facility] Suicide Risk Assessment (Signed)
[facility] Admission Suicide Risk Assessment   Nursing information obtained from:  Patient Demographic factors:  Male, Adolescent or young adult Current Mental Status:  Suicidal ideation indicated by patient Loss Factors:  Loss of significant relationship Historical Factors:  NA Risk Reduction Factors:  Sense of responsibility to family, Employed, Living with another person, especially a relative  Total Time spent with patient: 45 minutes Principal Problem: Major Depression, Recurrent, No Psychotic Features  Diagnosis:   Patient Active Problem List   Diagnosis Date Noted  . MDD (major depressive disorder), recurrent episode, severe (HCC) [F33.2] 03/30/2015  . No significant past medical history [[id number]]     Continued Clinical Symptoms:  Alcohol Use Disorder Identification Test Final Score (AUDIT): 2 The "Alcohol Use Disorders Identification Test", Guidelines for Use in Primary Care, Second Edition.  World Science writer [facility]). Score between 0-7:  no or low risk or alcohol related problems. Score between 8-15:  moderate risk of alcohol related problems. Score between 16-19:  high risk of alcohol related problems. Score 20 or above:  warrants further diagnostic evaluation for alcohol dependence and treatment.   CLINICAL FACTORS:  Patient is 40 year old married male, who reports worsening depression over recent weeks to months, with no identifiable external stressors to explain worsening mood. Describes recent increase in passive SI and describes neuro-vegetative symptoms of depression. (+) Cannabis Dependence .    Psychiatric Specialty Exam: ROS  Blood pressure 113/71, pulse 73, temperature 98.5 F (36.9 C), temperature source Oral, resp. rate 12, height  (1.753 m), weight 141 lb (63.957 kg).Body mass index is 20.81 kg/(m^2).   see admit note MSE  COGNITIVE FEATURES THAT CONTRIBUTE TO RISK:  Loss of executive function    SUICIDE RISK:   Moderate:  Frequent suicidal  ideation with limited intensity, and duration, some specificity in terms of plans, no associated intent, good self-control, limited dysphoria/symptomatology, some risk factors present, and identifiable protective factors, including available and accessible social support.  PLAN OF CARE: Patient will be admitted to inpatient psychiatric unit for stabilization and safety. Will provide and encourage milieu participation. Provide medication management and maked adjustments as needed.  Will follow daily.    I certify that inpatient services furnished can reasonably be expected to improve the patient's condition.   Nehemiah Massed, MD 03/30/2015, 11:30 AM

## 2015-03-30 NOTE — Plan of Care (Signed)
Problem: Diagnosis: Increased Risk For Suicide Attempt Goal: STG-Patient Will Comply With Medication Regime Outcome: Progressing Pt has been compliant with scheduled medication tonight.       

## 2015-03-30 NOTE — ED Notes (Signed)
Call placed to [facility] x 2 to inquire as to arrival time to transport patient. Informed by staff that he should arrive shortly.

## 2015-03-30 NOTE — H&P (Addendum)
Psychiatric Admission Assessment Adult  Patient Identification: Andre Miller MRN:  [id number] Date of Evaluation:  03/30/2015 Chief Complaint:   " I have been feeling more depressed" Principal Diagnosis:  Major Depression, Recurrent, Severe, No Psychotic Features / Cannabis Dependence  Diagnosis:   Patient Active Problem List   Diagnosis Date Noted  . MDD (major depressive disorder), recurrent episode, severe ([place]) [F33.2] 03/30/2015  . No significant past medical history [[id number]]    History of Present Illness:: 40 year old male, states he has a history of depression and anxiety, which he reports have been worsening over recent days to weeks .   States he has had depression " on and off " for " a few years ". He cannot identify any specific triggers or stressors that may be causing him to feel more depressed . He states  " actually things are getting better, after being unemployed for a long time, now I have full time employment ". He states he has been having some suicidal ideations, mostly passive thoughts of being dead- denies any actual attempts . Patient also endorses increased anxiety, panic attacks, without any clear triggers . Of note, denies agoraphobia, and denies history of psychosis. Endorses neuro-vegetative symptoms of depression as below. Of note, patient identifies cannabis dependence, he states he smokes " almost every day".   Associated Signs/Symptoms: Depression Symptoms:  depressed mood, anhedonia, insomnia, recurrent thoughts of death, anxiety, loss of energy/fatigue, decreased appetite, has lost 10 lbs over recent weeks  (Hypo) Manic Symptoms:   Denies  Anxiety Symptoms:   Reports excessive worrying , and some panic attacks, no agoraphobia  Psychotic Symptoms:  No psychotic symptoms PTSD Symptoms: Denies PTSD symptoms  Total Time spent with patient: 45 minutes  Past Psychiatric History:  No prior psychiatric admission . Denies any depression or mood  disorder prior to two to three years ago, denies history of mania, denies history of psychosis, states he has never attempted suicide , denies violence . Has never been on psychiatric medications .  He states he has not noticed any seasonal pattern to his depression.   Risk to Self: Is patient at risk for suicide?: Yes Risk to Others:   Prior Inpatient Therapy:   Prior Outpatient Therapy:    Alcohol Screening: 1. How often do you have a drink containing alcohol?: 2 to 4 times a month 2. How many drinks containing alcohol do you have on a typical day when you are drinking?: 1 or 2 3. How often do you have six or more drinks on one occasion?: Never Preliminary Score: 0 9. Have you or someone else been injured as a result of your drinking?: No 10. Has a relative or friend or a doctor or another health worker been concerned about your drinking or suggested you cut down?: No Alcohol Use Disorder Identification Test Final Score (AUDIT): 2 Brief Intervention: AUDIT score less than 7 or less-screening does not suggest unhealthy drinking-brief intervention not indicated Substance Abuse History in the last 12 months:  History of cannabis dependence, smokes almost daily, drinks occasionally on weekends, identifies self as social drinker , denies any other drug use . Consequences of Substance Abuse: Denies  Previous Psychotropic Medications:  Has never been on psychiatric medications in the past  Psychological Evaluations:  No  Past Medical History:  Denies any medical illnesses, NKDA, allergic to pineapple, does not smoke . Past Medical History  Diagnosis Date  . No significant past medical history    History reviewed. No  pertinent past surgical history. Family History:  Parents alive , separated when patient was a child, parents now live out of state, has two sisters. Family History  Problem Relation Age of Onset  . Ovarian cancer Mother    Family Psychiatric  History:  States that his sister  has depression and anxiety, no suicides in family. Aunts have history of substance abuse  Tobacco Screening: does not smoke or use tobacco products  Social History:  Married late last year, states marriage is going well , employed,  No children, no financial issues, denies legal issues . As above, does not identify any acute stressors to explain worsening depression History  Alcohol Use  . 1.2 oz/week  . 2 Shots of liquor per week    Comment: 2 shots of liquor every few weeks     History  Drug Use No    Comment: denied drug use. uds+ for [facility]    Additional Social History:  Allergies:   Allergies  Allergen Reactions  . Pineapple     Childhood reaction   Lab Results:  Results for orders placed or performed during the hospital encounter of 03/29/15 (from the past 48 hour(s))  Urine rapid drug screen (hosp performed) (Not at [facility])     Status: Abnormal   Collection Time: 03/29/15 10:53 PM  Result Value Ref Range   Opiates NONE DETECTED NONE DETECTED   Cocaine NONE DETECTED NONE DETECTED   Benzodiazepines NONE DETECTED NONE DETECTED   Amphetamines NONE DETECTED NONE DETECTED   Tetrahydrocannabinol POSITIVE (A) NONE DETECTED   Barbiturates NONE DETECTED NONE DETECTED    Comment:        DRUG SCREEN FOR MEDICAL PURPOSES ONLY.  IF CONFIRMATION IS NEEDED FOR ANY PURPOSE, NOTIFY LAB WITHIN 5 DAYS.        LOWEST DETECTABLE LIMITS FOR URINE DRUG SCREEN Drug Class       Cutoff (ng/mL) Amphetamine      1000 Barbiturate      200 Benzodiazepine   423 Tricyclics       953 Opiates          300 Cocaine          300 THC              50   Comprehensive metabolic panel     Status: Abnormal   Collection Time: 03/29/15 11:08 PM  Result Value Ref Range   Sodium 142 135 - 145 mmol/L   Potassium 4.3 3.5 - 5.1 mmol/L   Chloride 102 101 - 111 mmol/L   CO2 30 22 - 32 mmol/L   Glucose, Bld 88 65 - 99 mg/dL   BUN 14 6 - 20 mg/dL   Creatinine, Ser 1.05 0.61 - 1.24 mg/dL   Calcium 9.9 8.9 -  10.3 mg/dL   Total Protein 7.0 6.5 - 8.1 g/dL   Albumin 4.2 3.5 - 5.0 g/dL   AST 20 15 - 41 U/L   ALT 12 (L) 17 - 63 U/L   Alkaline Phosphatase 73 38 - 126 U/L   Total Bilirubin 0.1 (L) 0.3 - 1.2 mg/dL   GFR calc non Af Amer >60 >60 mL/min   GFR calc Af Amer >60 >60 mL/min    Comment: (NOTE) The eGFR has been calculated using the CKD EPI equation. This calculation has not been validated in all clinical situations. eGFR's persistently <60 mL/min signify possible Chronic Kidney Disease.    Anion gap 10 5 - 15  Ethanol (ETOH)  Status: None   Collection Time: 03/29/15 11:08 PM  Result Value Ref Range   Alcohol, Ethyl (B) <5 <5 mg/dL    Comment:        LOWEST DETECTABLE LIMIT FOR SERUM ALCOHOL IS 5 mg/dL FOR MEDICAL PURPOSES ONLY   Salicylate level     Status: None   Collection Time: 03/29/15 11:08 PM  Result Value Ref Range   Salicylate Lvl <1.6 2.8 - 30.0 mg/dL  Acetaminophen level     Status: Abnormal   Collection Time: 03/29/15 11:08 PM  Result Value Ref Range   Acetaminophen (Tylenol), Serum <10 (L) 10 - 30 ug/mL    Comment:        THERAPEUTIC CONCENTRATIONS VARY SIGNIFICANTLY. A RANGE OF 10-30 ug/mL MAY BE AN EFFECTIVE CONCENTRATION FOR MANY PATIENTS. HOWEVER, SOME ARE BEST TREATED AT CONCENTRATIONS OUTSIDE THIS RANGE. ACETAMINOPHEN CONCENTRATIONS >150 ug/mL AT 4 HOURS AFTER INGESTION AND >50 ug/mL AT 12 HOURS AFTER INGESTION ARE OFTEN ASSOCIATED WITH TOXIC REACTIONS.   CBC     Status: None   Collection Time: 03/29/15 11:08 PM  Result Value Ref Range   WBC 6.1 4.0 - 10.5 K/uL   RBC 5.04 4.22 - 5.81 MIL/uL   Hemoglobin 15.0 13.0 - 17.0 g/dL   HCT 43.9 39.0 - 52.0 %   MCV 87.1 78.0 - 100.0 fL   MCH 29.8 26.0 - 34.0 pg   MCHC 34.2 30.0 - 36.0 g/dL   RDW 14.1 11.5 - 15.5 %   Platelets 247 150 - 109 K/uL    Metabolic Disorder Labs:  No results found for: HGBA1C, MPG No results found for: PROLACTIN No results found for: CHOL, TRIG, HDL, CHOLHDL, VLDL,  LDLCALC  Current Medications: Current Facility-Administered Medications  Medication Dose Route Frequency Provider Last Rate Last Dose  . acetaminophen (TYLENOL) tablet 650 mg  650 mg Oral Q6H PRN Laverle Hobby, PA-C      . alum & mag hydroxide-simeth (MAALOX/MYLANTA) 200-200-20 MG/5ML suspension 30 mL  30 mL Oral Q4H PRN Laverle Hobby, PA-C      . hydrOXYzine (ATARAX/VISTARIL) tablet 25 mg  25 mg Oral Q6H PRN Laverle Hobby, PA-C      . magnesium hydroxide (MILK OF MAGNESIA) suspension 30 mL  30 mL Oral Daily PRN Laverle Hobby, PA-C      . traZODone (DESYREL) tablet 50 mg  50 mg Oral QHS,MR X 1 Spencer E Simon, PA-C       PTA Medications: Prescriptions prior to admission  Medication Sig Dispense Refill Last Dose  . acetaminophen (TYLENOL) 500 MG tablet Take 500 mg by mouth every 6 (six) hours as needed for pain.   unk  . ibuprofen (ADVIL,MOTRIN) 400 MG tablet Take 400 mg by mouth every 6 (six) hours as needed for mild pain.   unk    Musculoskeletal: Strength & Muscle Tone: within normal limits Gait & Station: normal Patient leans: N/A  Psychiatric Specialty Exam: Physical Exam  Review of Systems  Constitutional: Positive for weight loss.  HENT: Negative.   Eyes: Negative.   Respiratory: Negative.   Cardiovascular: Negative.   Gastrointestinal: Negative.   Genitourinary: Negative.   Musculoskeletal: Negative.   Skin: Negative.   Neurological: Negative for seizures.  Endo/Heme/Allergies: Negative.   Psychiatric/Behavioral: Positive for depression, suicidal ideas and substance abuse.  All other systems reviewed and are negative.   Blood pressure 113/71, pulse 73, temperature 98.5 F (36.9 C), temperature source Oral, resp. rate 12, height _0  (1.753 m),  weight 141 lb (63.957 kg).Body mass index is 20.81 kg/(m^2).  General Appearance: Fairly Groomed  Engineer, water:: fair   Speech:  Normal Rate  Volume:  Normal  Mood:  depressed, but states feeling " a little better  today"  Affect:  Constricted  Thought Process:  Linear  Orientation:  Other:  fully alert and attentive   Thought Content:  no hallucinations, no delusions, not internally preoccupied   Suicidal Thoughts:  No-  Denies any current suicidal ideations and contracts for safety on the unit  Homicidal Thoughts:  No- denies   Memory:  recent and remote grossly intact   Judgement:  Good  Insight:  Present  Psychomotor Activity:  Normal  Concentration:  Good  Recall:  Good  Fund of Knowledge:Good  Language: Good  Akathisia:  Negative  Handed:  Right  AIMS (if indicated):     Assets:  Desire for Improvement Physical Health  ADL's:  Intact  Cognition: WNL  Sleep:      Treatment Plan Summary: Daily contact with patient to assess and evaluate symptoms and progress in treatment, Medication management, Plan inpatient admission and medications as below   Observation Level/Precautions:  15 minute checks  Laboratory:  will check TSH, Vit B12, Folate, Vitamin D - check vitamin serum levels due to patient's report of  decreased appetite and recent weight loss  Psychotherapy:   Milieu, groups   Medications:  Agrees to Celexa  20 mgrs QDAY - we discussed side effects and rationale . Trazodone QHS  For sleep  Consultations: as needed     Discharge Concerns:-     Estimated LOS: 4-5 days   Other:     I certify that inpatient services furnished can reasonably be expected to improve the patient's condition.    Neita Garnet, MD 2/2/201711:00 AM

## 2015-03-30 NOTE — Progress Notes (Signed)
Adult Psychoeducational Group Note  Date:  03/30/2015 Time:  0830  Group Topic/Focus:  Orientation:   The focus of this group is to educate the patient on the purpose and policies of crisis stabilization and provide a format to answer questions about their admission.  The group details unit policies and expectations of patients while admitted.  Participation Level:  Did Not Attend  Participation Quality:    Affect:    Cognitive:    Insight:   Engagement in Group:    Modes of Intervention:    Additional Comments:    Murdis Flitton L 03/30/2015, 11:49 AM
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
## 2015-03-30 NOTE — Progress Notes (Signed)
D: Pt is a 40 yr old married male admitted with SI. Pt reports having thoughts of overdosing on Tylenol PM.Pt is unable to identify any specific stressors to his SI or his other psychosocial symptoms. "If I knew I wouldn't be here".  Pt endorses SI, depression, anxiety, and hopelessness. Pt also reports insomnia with a decreased appetite. Pt loss about 10 lbs over the last few weeks. Pt denies any illicit drug use. Pt's UDS was (+) for THC. Pt denied any PMH or prior mental health treatment. Pt oriented to the unit's policies and procedures. Pt verbally contracts for safety. Pt remains safe at this time.

## 2015-03-30 NOTE — ED Notes (Signed)
[facility] contacted.  

## 2015-03-30 NOTE — Progress Notes (Signed)
Pt did not attend evening karaoke wrap-up group.

## 2015-03-30 NOTE — [facility] Group Notes (Signed)
[facility] LCSW Group Therapy 03/30/2015  1:15 PM   Type of Therapy: Group Therapy  Participation Level: Did Not Attend. Patient invited to participate but declined.   Joaovictor Krone, MSW, LCSW Clinical Social Worker [facility] [phone]   

## 2015-03-30 NOTE — Plan of Care (Signed)
Problem: Consults Goal: Depression Patient Education See Patient Education Module for education specifics.  Outcome: Progressing Nurse discussed depression/coping skills with patient.        

## 2015-03-30 NOTE — Tx Team (Addendum)
Interdisciplinary Treatment Plan Update (Adult) Date: 03/30/2015    Time Reviewed: 9:30 AM  Progress in Treatment: Attending groups: Continuing to assess, patient new to milieu Participating in groups: Continuing to assess, patient new to milieu Taking medication as prescribed: Yes Tolerating medication: Yes Family/Significant other contact made: No, patient has declined for collateral contacts Patient understands diagnosis: Yes Discussing patient identified problems/goals with staff: Yes Medical problems stabilized or resolved: Yes Denies suicidal/homicidal ideation: Yes Issues/concerns per patient self-inventory: Yes Other:  New problem(s) identified: N/A  Discharge Plan or Barriers: Patient will return home to follow up with outpatient services.  Reason for Continuation of Hospitalization:  Depression Anxiety Medication Stabilization   Comments: N/A  Estimated length of stay: 2-3 days    Patient is a 40 year old male who presented to the hospital with depression and suicidal ideations. Pt denies any primary trigger(s) for admission. Patient will benefit from crisis stabilization, medication evaluation, group therapy and psycho education in addition to case management for discharge planning. At discharge, it is recommended that Pt remain compliant with established discharge plan and continued treatment.   Review of initial/current patient goals per problem list:  1. Goal(s): Patient will participate in aftercare plan   Met: Yes   Target date: 3-5 days post admission date   As evidenced by: Patient will participate within aftercare plan AEB aftercare provider and housing plan at discharge being identified.  2/2: Goal met. Patient plans to return home to follow up with outpatient services.    2. Goal (s): Patient will exhibit decreased depressive symptoms and suicidal ideations.   Met: Adequate for discharge per MD   Target date: 3-5 days post admission  date   As evidenced by: Patient will utilize self rating of depression at 3 or below and demonstrate decreased signs of depression or be deemed stable for discharge by MD.  2/2: Goal not met: Pt presents with flat affect and depressed mood.  Pt admitted with depression rating of 10.  Pt to show decreased sign of depression and a rating of 3 or less before d/c.    2/3: Adequate for discharge per MD. Patient reports improvement in his symptoms and denies SI.      3. Goal(s): Patient will demonstrate decreased signs and symptoms of anxiety.   Met: Adequate for discharge per MD   Target date: 3-5 days post admission date   As evidenced by: Patient will utilize self rating of anxiety at 3 or below and demonstrated decreased signs of anxiety, or be deemed stable for discharge by MD  2/2: Goal not met: Pt presents with anxious mood and affect.  Pt admitted with anxiety rating of 10.  Pt to show decreased sign of anxiety and a rating of 3 or less before d/c.  2/3: Adequate for discharge per MD. Patient reports improvement in his symptoms and reports feeling safe for discharge.     Attendees: Patient:    Family:    Physician: Dr. Parke Poisson; Dr. Sabra Heck 03/30/2015 9:30 AM  Nursing:  Loletta Specter, Grayland Ormond, [place], RN 03/30/2015 9:30 AM  Clinical Social Worker: Tilden Fossa, LCSW 03/30/2015 9:30 AM  Other: Peri Maris, LCSWA; [place], LCSW  03/30/2015 9:30 AM  Other:  03/30/2015 9:30 AM  Other: Lars Pinks, Case Manager 03/30/2015 9:30 AM  Other: Agustina Caroli, May Augustin , NP 03/30/2015 9:30 AM  Other:    Other:    Other:    Other:     Scribe for Treatment Team:  Tilden Fossa, [place]

## 2015-03-30 NOTE — ED Notes (Signed)
TTS placed at bedside. Corrie Dandy, [facility], to call for assessment. Sitter in room with patient/spouse.

## 2015-03-31 LAB — VITAMIN B12: Vitamin B-12: 548 pg/mL (ref 180–914)

## 2015-03-31 LAB — TSH: TSH: 0.435 u[IU]/mL (ref 0.350–4.500)

## 2015-03-31 LAB — FOLATE: FOLATE: 27.8 ng/mL (ref >5.9)

## 2015-03-31 NOTE — Progress Notes (Signed)
[facility] MD Progress Note  03/31/2015 2:35 PM Andre Miller  MRN:  [id number] Subjective:   Patient reports he is " doing OK". At this time minimizes depression or anxiety, and focuses on wanting to be discharged soon. States " I don't like feeling closed in, I want to go back home and be with my wife ". Denies medication side effects ( currently on Celexa trial)  Objective : I have discussed case with treatment team and have met with patient . Limited group/ milieu participation- regarding this,  states " It's just that I want to go home " . Denies social anxiety or significant anxiety symptoms at this time. Currently minimizes depressive symptoms - he is future oriented . He presents with improved mood compared to admission, affect is reactive, vaguely irritable . Behavior on unit has been calm, in good control. As above, denies medication side effects. With his express consent and in his presence I spoke with his wife via phone- she visited him yesterday on unit and states that he seemed much improved and back to his usual self . She does report that on day of admission patient presented quite depressed, tearful.  She agrees with him being discharged soon. Labs - TSH, B12, Folate WNL.   Principal Problem:  Major Depression  Diagnosis:   Patient Active Problem List   Diagnosis Date Noted  . MDD (major depressive disorder), recurrent episode, severe ([place]) [F33.2] 03/30/2015  . No significant past medical history [IMO0001]    Total Time spent with patient: 25 minutes     Past Medical History:  Past Medical History  Diagnosis Date  . No significant past medical history    History reviewed. No pertinent past surgical history. Family History:  Family History  Problem Relation Age of Onset  . Ovarian cancer Mother     Social History:  History  Alcohol Use  . 1.2 oz/week  . 2 Shots of liquor per week    Comment: 2 shots of liquor every few weeks     History  Drug Use No   Comment: denied drug use. uds+ for [facility]    Social History   Social History  . Marital Status: Single    Spouse Name: N/A  . Number of Children: N/A  . Years of Education: N/A   Social History Main Topics  . Smoking status: Never Smoker   . Smokeless tobacco: None  . Alcohol Use: 1.2 oz/week    2 Shots of liquor per week     Comment: 2 shots of liquor every few weeks  . Drug Use: No     Comment: denied drug use. uds+ for [facility]  . Sexual Activity: Yes    Birth Control/ Protection: Condom   Other Topics Concern  . None   Social History Narrative   Additional Social History:   Sleep: Good  Appetite:  Good  Current Medications: Current Facility-Administered Medications  Medication Dose Route Frequency Provider Last Rate Last Dose  . acetaminophen (TYLENOL) tablet 650 mg  650 mg Oral Q6H PRN Laverle Hobby, PA-C      . alum & mag hydroxide-simeth (MAALOX/MYLANTA) 200-200-20 MG/5ML suspension 30 mL  30 mL Oral Q4H PRN Laverle Hobby, PA-C      . citalopram (CELEXA) tablet 20 mg  20 mg Oral Daily Jenne Campus, MD   20 mg at 03/31/15 0630  . feeding supplement (ENSURE ENLIVE) (ENSURE ENLIVE) liquid 237 mL  237 mL Oral BID BM Felicita Gage A  Cobos, MD   237 mL at 03/30/15 1607  . hydrOXYzine (ATARAX/VISTARIL) tablet 25 mg  25 mg Oral Q6H PRN Laverle Hobby, PA-C      . magnesium hydroxide (MILK OF MAGNESIA) suspension 30 mL  30 mL Oral Daily PRN Laverle Hobby, PA-C      . traZODone (DESYREL) tablet 50 mg  50 mg Oral QHS,MR X 1 Laverle Hobby, PA-C   50 mg at 03/30/15 2103    Lab Results:  Results for orders placed or performed during the hospital encounter of 03/30/15 (from the past 48 hour(s))  TSH     Status: None   Collection Time: 03/31/15  6:33 AM  Result Value Ref Range   TSH 0.435 0.350 - 4.500 uIU/mL    Comment: Performed at [facility]  Vitamin B12     Status: None   Collection Time: 03/31/15  6:33 AM  Result Value Ref Range   Vitamin B-12 548  180 - 914 pg/mL    Comment: (NOTE) This assay is not validated for testing neonatal or myeloproliferative syndrome specimens for Vitamin B12 levels. Performed at [facility]   Folate     Status: None   Collection Time: 03/31/15  6:33 AM  Result Value Ref Range   Folate 27.8 >5.9 ng/mL    Comment: Performed at [facility]    Physical Findings: AIMS: Facial and Oral Movements Muscles of Facial Expression: None, normal Lips and Perioral Area: None, normal Jaw: None, normal Tongue: None, normal,Extremity Movements Upper (arms, wrists, hands, fingers): None, normal Lower (legs, knees, ankles, toes): None, normal, Trunk Movements Neck, shoulders, hips: None, normal, Overall Severity Severity of abnormal movements (highest score from questions above): None, normal Incapacitation due to abnormal movements: None, normal Patient's awareness of abnormal movements (rate only patient's report): No Awareness, Dental Status Current problems with teeth and/or dentures?: No Does patient usually wear dentures?: No  CIWA:  CIWA-Ar Total: 1 COWS:  COWS Total Score: 1  Musculoskeletal: Strength & Muscle Tone: within normal limits Gait & Station: normal Patient leans: N/A  Psychiatric Specialty Exam: ROS no  headache, no  chest pain , no SOB   Blood pressure 112/65, pulse 85, temperature 98.5 F (36.9 C), temperature source Oral, resp. rate 20, height '5\' 9"'  (1.753 m), weight 141 lb (63.957 kg).Body mass index is 20.81 kg/(m^2).  General Appearance: improved grooming   Eye Contact::  Good  Speech:  Normal Rate  Volume:  Normal  Mood:  improved, denies depression at this time  Affect:  reactive, vaguely irritable   Thought Process:  Linear  Orientation:  Full (Time, Place, and Person)  Thought Content:  denies hallucinations, no delusions   Suicidal Thoughts:  No- denies any suicidal or  Self injurious ideations , denies any violent or homicidal ideations  Homicidal Thoughts:   No  Memory:  recent and remote grossly intact   Judgement:   Improving   Insight:  improving   Psychomotor Activity:  Normal  Concentration:  Good  Recall:  Good  Fund of Knowledge:Good  Language: Good  Akathisia:  Negative  Handed:  Right  AIMS (if indicated):     Assets:  Desire for Improvement Resilience Social Support  ADL's:  Intact  Cognition: WNL  Sleep:  Number of Hours: 6.5  Assessment- at this time patient denies depression or any significant neuro-vegetative symptoms. He denies any SI. He presents vaguely irritable, but behavior on unit in good control- limited milieu  participation.  He is focused on being discharged soon. Collateral information from wife is that patient presents  much improved. Tolerating Celexa trial well .  Treatment Plan Summary: Daily contact with patient to assess and evaluate symptoms and progress in treatment, Medication management, Plan inpatient treatment  and medications as below  Continue to encourage group/milieu participation for coping skills and symptom reduction. Continue CELEXA 20 mgrs QDAY for depression Continue TRAZODONE 50 mgrs QHS  For insomnia as needed  Continue VISTARIL 25 mgrs Q 6 Hours PRN for agitation as needed  Treatment team working on disposition planning  Neita Garnet, MD 03/31/2015, 2:35 PM

## 2015-03-31 NOTE — Progress Notes (Signed)
D.  Pt pleasant on approach, in room on bed.  No complaints voiced at this time.  Pt states he feels ready for discharge.  Pt denies SI/HI/hallucinations at this time.  Minimal interaction on unit this evening.  A.  Support and encouragement offered  R.  Pt remains safe on the unit, will continue to monitor.

## 2015-03-31 NOTE — Progress Notes (Signed)
  [facility] Adult Case Management Discharge Plan :  Will you be returning to the same living situation after discharge:  Yes,  patient plans to return home At discharge, do you have transportation home?: Yes,  wife will pick up Do you have the ability to pay for your medications: Yes,  patient will be provided with prescriptions at discharge  Release of information consent forms completed and in the chart;  Patient's signature needed at discharge.  Patient to Follow up at: Follow-up Information    Follow up with [facility].   Specialty:  Behavioral Health   Why:  Walk-in clinic Monday-Friday between 8am to 3pm for assessment for therapy and medication management services. Please arrive early in order to be seen as quickly as possible.    Contact information:   [address] [place] [address] [phone]       Next level of care provider has access to [facility] Link:no  Safety Planning and Suicide Prevention discussed: Yes,  with patient  Have you used any form of tobacco in the last 30 days? (Cigarettes, Smokeless Tobacco, Cigars, and/or Pipes): No  Has patient been referred to the Quitline?: N/A patient is not a smoker  Patient has been referred for addiction treatment: Yes  Olando Willems, [place] 03/31/2015, 3:10 PM

## 2015-03-31 NOTE — Progress Notes (Signed)
Recreation Therapy Notes  Date: 02.03.2017 Time: 9:30am Location: [address] Group Room   Group Topic: Stress Management  Goal Area(s) Addresses:  Patient will actively participate in stress management techniques presented during session.   Behavioral Response: Did not attend.   [name] L Katilynn Sinkler, LRT/CTRS        Andre Miller L 03/31/2015 3:36 PM 

## 2015-03-31 NOTE — [facility] Group Notes (Signed)
[facility] LCSW Aftercare Discharge Planning Group Note  03/31/2015  8:45 AM  Participation Quality: Did Not Attend. Patient invited to participate but declined.  Rogenia Werntz, MSW, LCSW Clinical Social Worker [facility] [phone]   

## 2015-03-31 NOTE — Progress Notes (Signed)
Patient ID: Andre Miller, male   DOB: [birth year], 40 y.o.   MRN: [id number]  D: Patient has a flat affect on approach but was sleeping after breakfast. Reports that his mood has improved since admission here and signed a 72 hour request for discharge yesterday. Slept well during the night. Appetite fair. A: Staff will monitor on q 15 minute checks, follow treatment plan, and give medications as ordered. R: Took Celexa as ordered and went back to bed at this time.

## 2015-03-31 NOTE — Progress Notes (Signed)
D: Patient resting in bed with eyes closed.  Respirations even and unlabored.  Patient appears to be in no apparent distress. A: Staff to monitor Q 15 mins for safety.   R:Patient remains safe on the unit.  

## 2015-03-31 NOTE — Plan of Care (Signed)
Problem: Ineffective individual coping Goal: STG: Patient will remain free from self harm Outcome: Progressing Patient has not attempted to harm self and currently denies any self harm.

## 2015-03-31 NOTE — [facility] Group Notes (Signed)
[facility] LCSW Group Therapy 03/31/2015  1:15 PM   Type of Therapy: Group Therapy  Participation Level: Did Not Attend. Patient invited to participate but declined.   Aava Deland, MSW, LCSW Clinical Social Worker [facility] [phone]   

## 2015-04-01 LAB — VITAMIN D 25 HYDROXY (VIT D DEFICIENCY, FRACTURES): VIT D 25 HYDROXY: 25.6 ng/mL — AB (ref 30.0–100.0)

## 2015-04-01 MED ORDER — HYDROXYZINE HCL 25 MG PO TABS: 25.0000 mg | ORAL_TABLET | Freq: Four times a day (QID) | ORAL | Status: DC | PRN

## 2015-04-01 MED ORDER — TRAZODONE HCL 50 MG PO TABS: 50.0000 mg | ORAL_TABLET | Freq: Every evening | ORAL | Status: AC | PRN

## 2015-04-01 MED ORDER — CITALOPRAM HYDROBROMIDE 20 MG PO TABS: 20.0000 mg | ORAL_TABLET | Freq: Every day | ORAL | Status: AC

## 2015-04-01 MED ORDER — CITALOPRAM HYDROBROMIDE 20 MG PO TABS: 20.0000 mg | ORAL_TABLET | Freq: Every day | ORAL | Status: DC

## 2015-04-01 MED ORDER — HYDROXYZINE HCL 25 MG PO TABS: 25.0000 mg | ORAL_TABLET | Freq: Four times a day (QID) | ORAL | Status: AC | PRN

## 2015-04-01 MED ORDER — TRAZODONE HCL 50 MG PO TABS: 50.0000 mg | ORAL_TABLET | Freq: Every evening | ORAL | Status: DC | PRN

## 2015-04-01 NOTE — [facility] Group Notes (Addendum)
[facility] Group Notes:  (Nursing/MHT/Case Management/Adjunct)  Date:  04/01/2015  Time:  10:25 AM  Type of Therapy:  Psychoeducational Skills Coping Skills  Participation Level:  Active  Participation Quality:  Appropriate  Affect:  Appropriate  Cognitive:  Alert and Appropriate  Insight:  Good  Engagement in Group:  Engaged  Modes of Intervention:  Discussion, Education and Exploration  Summary of Progress/Problems: Patient stating "I feel good today" and states his goal for the day and every day is to "be honest"; it's just amazing when you're honest with people and tell them what's going on, like when I told my wife, she didn't know I was having those thoughts so you have to be honest with people". States his positive coping skills is "listening to music". Olen Cordial 04/01/2015, 10:25 AM

## 2015-04-01 NOTE — Progress Notes (Signed)
Adult Psychoeducational Group Note  Date:  04/01/2015 Time:  9:39 PM  Group Topic/Focus:  Wrap-Up Group:   The focus of this group is to help patients review their daily goal of treatment and discuss progress on daily workbooks.  Participation Level:  Active  Participation Quality:  Appropriate and Attentive  Affect:  Appropriate  Cognitive:  Appropriate  Insight: Appropriate  Engagement in Group:  Engaged  Modes of Intervention:  Discussion  Additional Comments:  Pt stated his goal is to go home and stay on his medications and take them consistently like he is supposed to. He feels his medications are working and he is feeling better. Pt stated one positive thing today is he was able to talk with another peer and it made them feel better, which made him feel good.  Caswell Corwin 04/01/2015, 9:39 PM

## 2015-04-01 NOTE — Progress Notes (Signed)
Patient excited going home tomorrow. Wife came visiting stated she will be here tomorrow morning 7 am to take patient home. Patient remains appropriate and compliant.

## 2015-04-01 NOTE — [facility] Group Notes (Signed)
[facility] LCSW Group Therapy Note  04/01/2015 10 AM  Type of Therapy and Topic:  Group Therapy: Avoiding Self-Sabotaging and Enabling Behaviors  Participation Level:  Active   Description of Group:     Learn how to identify obstacles, self-sabotaging and enabling behaviors, what are they, why do we do them and what needs do these behaviors meet? Discuss unhealthy relationships and how to have positive healthy boundaries with those that sabotage and enable. Explore aspects of self-sabotage and enabling in yourself and how to limit these self-destructive behaviors in everyday life. A scaling question is used to help patient look at where they are now in their motivation to change.    Therapeutic Goals: 1. Patient will identify one obstacle that relates to self-sabotage and enabling behaviors 2. Patient will identify one personal self-sabotaging or enabling behavior they did prior to admission 3. Patient able to establish a plan to change the above identified behavior they did prior to admission:  4. Patient will demonstrate ability to communicate their needs through discussion and/or role plays.   Summary of Patient Progress: The main focus of today's process group was to explain to what "self-sabotage" means and use Motivational Interviewing to discuss what benefits, negative or positive, were involved in a self-identified self-sabotaging behavior. We then talked about reasons the patient may want to change the behavior and their current desire to change. The Stages of Change were explained using a handout, and patients identified where they currently are with regard to stages of change. Jamar feels he is in the action stage of change and processed the reality of keeping motivation up verses returning to old patterns.     Therapeutic Modalities:   Cognitive Behavioral Therapy Person-Centered Therapy Motivational Interviewing   Carney Bern, LCSW

## 2015-04-01 NOTE — Progress Notes (Signed)
[facility] MD Progress Note  04/01/2015 1:58 PM Andre Miller  MRN:  [id number] Subjective: Patient reports " I am feeling fine, I just want to be discharged to my wife"  Objective : Patient is awake, alert and oriented X3 , found resting in his bedroom.  Denies suicidal or homicidal ideation. Denies auditory or visual hallucination and does not appear to be responding to internal stimuli. Patient reports interacting well with staff and others and states that he mostly stays to his self. Patient reports he is medication compliant without mediation side effects of the Celexa. States that he feels "this medication is working."  Reports depression in the past, however was able to "shake it' . Reports this time he felt he needed help. But I don't want to stay here.  States his depression 0/10. Reports good appetite and resting well. Support, encouragement and reassurance was provided.   Principal Problem:  Major Depression  Diagnosis:   Patient Active Problem List   Diagnosis Date Noted  . MDD (major depressive disorder), recurrent episode, severe ([facility]) [F33.2] 03/30/2015  . No significant past medical history [IMO0001]    Total Time spent with patient: 25 minutes     Past Medical History:  Past Medical History  Diagnosis Date  . No significant past medical history    History reviewed. No pertinent past surgical history. Family History:  Family History  Problem Relation Age of Onset  . Ovarian cancer Mother     Social History:  History  Alcohol Use  . 1.2 oz/week  . 2 Shots of liquor per week    Comment: 2 shots of liquor every few weeks     History  Drug Use No    Comment: denied drug use. uds+ for [facility]    Social History   Social History  . Marital Status: Single    Spouse Name: N/A  . Number of Children: N/A  . Years of Education: N/A   Social History Main Topics  . Smoking status: Never Smoker   . Smokeless tobacco: None  . Alcohol Use: 1.2 oz/week    2 Shots of liquor  per week     Comment: 2 shots of liquor every few weeks  . Drug Use: No     Comment: denied drug use. uds+ for [facility]  . Sexual Activity: Yes    Birth Control/ Protection: Condom   Other Topics Concern  . None   Social History Narrative   Additional Social History:   Sleep: Good  Appetite:  Good  Current Medications: Current Facility-Administered Medications  Medication Dose Route Frequency Provider Last Rate Last Dose  . acetaminophen (TYLENOL) tablet 650 mg  650 mg Oral Q6H PRN Kerry Hough, PA-C      . alum & mag hydroxide-simeth (MAALOX/MYLANTA) 200-200-20 MG/5ML suspension 30 mL  30 mL Oral Q4H PRN Kerry Hough, PA-C      . citalopram (CELEXA) tablet 20 mg  20 mg Oral Daily Craige Cotta, MD   20 mg at 04/01/15 0804  . feeding supplement (ENSURE ENLIVE) (ENSURE ENLIVE) liquid 237 mL  237 mL Oral BID BM Rockey Situ Cobos, MD   237 mL at 04/01/15 0804  . hydrOXYzine (ATARAX/VISTARIL) tablet 25 mg  25 mg Oral Q6H PRN Kerry Hough, PA-C      . magnesium hydroxide (MILK OF MAGNESIA) suspension 30 mL  30 mL Oral Daily PRN Kerry Hough, PA-C      . traZODone (DESYREL) tablet 50 mg  50 mg Oral QHS,MR X 1 Kerry Hough, PA-C   50 mg at 03/31/15 2105    Lab Results:  Results for orders placed or performed during the hospital encounter of 03/30/15 (from the past 48 hour(s))  TSH     Status: None   Collection Time: 03/31/15  6:33 AM  Result Value Ref Range   TSH 0.435 0.350 - 4.500 uIU/mL    Comment: Performed at [facility]  Vitamin B12     Status: None   Collection Time: 03/31/15  6:33 AM  Result Value Ref Range   Vitamin B-12 548 180 - 914 pg/mL    Comment: (NOTE) This assay is not validated for testing neonatal or myeloproliferative syndrome specimens for Vitamin B12 levels. Performed at [facility]   Folate     Status: None   Collection Time: 03/31/15  6:33 AM  Result Value Ref Range   Folate 27.8 >5.9 ng/mL    Comment: Performed  at [facility]  VITAMIN D 25 Hydroxy (Vit-D Deficiency, Fractures)     Status: Abnormal   Collection Time: 03/31/15  6:33 AM  Result Value Ref Range   Vit D, 25-Hydroxy 25.6 (L) 30.0 - 100.0 ng/mL    Comment: (NOTE) Vitamin D deficiency has been defined by the Institute of Medicine and an Endocrine Society practice guideline as a level of serum 25-OH vitamin D less than 20 ng/mL (1,2). The Endocrine Society went on to further define vitamin D insufficiency as a level between 21 and 29 ng/mL (2). 1. IOM (Institute of Medicine). 2010. Dietary reference   intakes for calcium and D. [place]: The   [employer]. 2. Holick MF, Binkley Hudson, Bischoff-Ferrari HA, et al.   Evaluation, treatment, and prevention of vitamin D   deficiency: an Endocrine Society clinical practice   guideline. JCEM. 2011 Jul; 96(7):1911-30. Performed At: [facility] [address] [place], [place] [id number] Mila Homer MD WU:[id number] Performed at [facility]     Physical Findings: AIMS: Facial and Oral Movements Muscles of Facial Expression: None, normal Lips and Perioral Area: None, normal Jaw: None, normal Tongue: None, normal,Extremity Movements Upper (arms, wrists, hands, fingers): None, normal Lower (legs, knees, ankles, toes): None, normal, Trunk Movements Neck, shoulders, hips: None, normal, Overall Severity Severity of abnormal movements (highest score from questions above): None, normal Incapacitation due to abnormal movements: None, normal Patient's awareness of abnormal movements (rate only patient's report): No Awareness, Dental Status Current problems with teeth and/or dentures?: No Does patient usually wear dentures?: No  CIWA:  CIWA-Ar Total: 1 COWS:  COWS Total Score: 1  Musculoskeletal: Strength & Muscle Tone: within normal limits Gait & Station: normal Patient leans: N/A  Psychiatric Specialty Exam: Review of Systems   Psychiatric/Behavioral: Negative for depression and suicidal ideas. The patient is not nervous/anxious.   All other systems reviewed and are negative.  no  headache, no  chest pain , no SOB   Blood pressure 91/52, pulse 59, temperature 97.6 F (36.4 C), temperature source Oral, resp. rate 16, height  (1.753 m), weight 63.957 kg (141 lb).Body mass index is 20.81 kg/(m^2).  General Appearance: Casual. Pleasant, clam and cooperative   Eye Contact::  Good  Speech:  Normal Rate  Volume:  Normal  Mood:  improved, denies depression at this time  Affect:  Congruent  Thought Process:  Linear  Orientation:  Full (Time, Place, and Person)  Thought Content:  denies hallucinations, no  delusions   Suicidal Thoughts:  No- denies any suicidal or  Self injurious ideations , denies any violent or homicidal ideations  Homicidal Thoughts:  No  Memory:  recent and remote grossly intact   Judgement:   Improving   Insight:  Fair and improving   Psychomotor Activity:  Normal  Concentration:  Good  Recall:  Good  Fund of Knowledge:Good  Language: Good  Akathisia:  Negative  Handed:  Right  AIMS (if indicated):     Assets:  Desire for Improvement Resilience Social Support  ADL's:  Intact  Cognition: WNL  Sleep:  Number of Hours: 5.5     I agree with current treatment plan on 02/04//2017. Patient seen face-to-face for psychiatric evaluation follow-up, chart reviewed and case discussed with the MD Jagger Demonte. Depression minimized. Reviewed the information documented and agree with the treatment plan.  Treatment Plan Summary: Daily contact with patient to assess and evaluate symptoms and progress in treatment, Medication management, Plan inpatient treatment  and medications as below    Continue to encourage group/milieu participation for coping skills and symptom reduction. Continue CELEXA 20 mgrs QDAY for depression Continue TRAZODONE 50 mgrs QHS  For insomnia as needed  Continue VISTARIL 25 mgrs Q  6 Hours PRN for agitation as needed  Treatment team working on disposition planning Consider discharge home on 04/02/2015.   Oneta Rack, NP 04/01/2015, 1:58 PM   I agreed with the findings, treatment and disposition plan of this patient.  Kathryne Sharper, MD

## 2015-04-01 NOTE — Progress Notes (Signed)
D:Per patient self inventory form pt reports  He slept good last night with the use of sleep medication. Pt reports a good appetite, normal energy level, good concentration. Pt rates depression 0/10, hopelessness 0/10, anxiety 0/10- all on 0-10 scale, 10 being the worse . Pt denies physical pain. Pt reports his goal is "going home with my wife" Pt reports he will meet his goal by "wait for discharge" "I apologize for any bad attitude I displayed during my stay here" Pt behavior calm and cooperative. Denies SI/HI. Denies AVH.  A:Special checks q 15 mins in place for safety. Medication administered per MD order (See eMAR) Encouragement and support provided.  R:Safety maintained. Compliant with medication regimen. Will continue to monitor.

## 2015-04-01 NOTE — Discharge Summary (Signed)
Physician Discharge Summary Note  Patient:  Andre Miller is an 40 y.o., male MRN:  [id number] DOB:  1975-12-30 Patient phone:  [phone] (home)  Patient address:   [address] [place] [address],  Total Time spent with patient: 30 minutes  Date of Admission:  03/30/2015 Date of Discharge: 04/01/2015  Reason for Admission: Per HPI-40 year old male, states he has a history of depression and anxiety, which he reports have been worsening over recent days to weeks . States he has had depression " on and off " for " a few years ". He cannot identify any specific triggers or stressors that may be causing him to feel more depressed . He states " actually things are getting better, after being unemployed for a long time, now I have full time employment ". He states he has been having some suicidal ideations, mostly passive thoughts of being dead- denies any actual attempts . Patient also endorses increased anxiety, panic attacks, without any clear triggers . Of note, denies agoraphobia, and denies history of psychosis. Endorses neuro-vegetative symptoms of depression as below. Of note, patient identifies cannabis dependence, he states he smokes " almost every day".   Principal Problem: MDD (major depressive disorder), recurrent episode, severe [facility]) Discharge Diagnoses: Patient Active Problem List   Diagnosis Date Noted  . MDD (major depressive disorder), recurrent episode, severe (HCC) [F33.2] 03/30/2015    Priority: High  . No significant past medical history [IMO0001]     Past Psychiatric History:  MDD  Past Medical History:  Past Medical History  Diagnosis Date  . No significant past medical history    History reviewed. No pertinent past surgical history. Family History:  Family History  Problem Relation Age of Onset  . Ovarian cancer Mother    Family Psychiatric  History:  Denies Social History:  History  Alcohol Use  . 1.2 oz/week  . 2 Shots of  liquor per week    Comment: 2 shots of liquor every few weeks     History  Drug Use No    Comment: denied drug use. uds+ for [facility]    Social History   Social History  . Marital Status: Single    Spouse Name: N/A  . Number of Children: N/A  . Years of Education: N/A   Social History Main Topics  . Smoking status: Never Smoker   . Smokeless tobacco: None  . Alcohol Use: 1.2 oz/week    2 Shots of liquor per week     Comment: 2 shots of liquor every few weeks  . Drug Use: No     Comment: denied drug use. uds+ for [facility]  . Sexual Activity: Yes    Birth Control/ Protection: Condom   Other Topics Concern  . None   Social History Narrative    Hospital Course:  Carylon Perches was admitted for MDD (major depressive disorder), recurrent episode, severe (HCC) and crisis management.  He was treated with the following medications as listed below.  Carylon Perches was discharged with current medication and was instructed on how to take medications as prescribed; (details listed below under Medication List).  Medical problems were identified and treated as needed.  Home medications were restarted as appropriate.  Improvement was monitored by observation and Carylon Perches daily report of symptom reduction.  Emotional and mental status was monitored by daily self-inventory reports completed by Carylon Perches and clinical staff.  Carylon Perches was evaluated by the treatment team for stability and plans for continued recovery upon discharge.  Carylon Perches motivation was an integral factor for scheduling further treatment.  Employment, transportation, bed availability, health status, family support, and any pending legal issues were also considered during his hospital stay.  He was offered further treatment options upon discharge including but not limited to Residential, Intensive Outpatient, and Outpatient treatment.  Carylon Perches will follow up with the services  as listed below under Follow Up Information.     Upon completion of this admission the [employer] was both mentally and medically stable for discharge denying suicidal/homicidal ideation, auditory/visual/tactile hallucinations, delusional thoughts and paranoia.       Physical Findings: AIMS: Facial and Oral Movements Muscles of Facial Expression: None, normal Lips and Perioral Area: None, normal Jaw: None, normal Tongue: None, normal,Extremity Movements Upper (arms, wrists, hands, fingers): None, normal Lower (legs, knees, ankles, toes): None, normal, Trunk Movements Neck, shoulders, hips: None, normal, Overall Severity Severity of abnormal movements (highest score from questions above): None, normal Incapacitation due to abnormal movements: None, normal Patient's awareness of abnormal movements (rate only patient's report): No Awareness, Dental Status Current problems with teeth and/or dentures?: No Does patient usually wear dentures?: No  CIWA:  CIWA-Ar Total: 1 COWS:  COWS Total Score: 1  Musculoskeletal: Strength & Muscle Tone: within normal limits Gait & Station: normal Patient leans: N/A  Psychiatric Specialty Exam:  SEE MD SRA Review of Systems  Psychiatric/Behavioral: Negative for suicidal ideas and hallucinations. Depression: stable. Nervous/anxious: stable.   All other systems reviewed and are negative.   Blood pressure 91/52, pulse 59, temperature 97.6 F (36.4 C), temperature source Oral, resp. rate 16, height  (1.753 m), weight 63.957 kg (141 lb).Body mass index is 20.81 kg/(m^2).  Have you used any form of tobacco in the last 30 days? (Cigarettes, Smokeless Tobacco, Cigars, and/or Pipes): No  Has this patient used any form of tobacco in the last 30 days? (Cigarettes, Smokeless Tobacco, Cigars, and/or Pipes) Yes, offered  Metabolic Disorder Labs:  No results found for: HGBA1C, MPG No results found for: PROLACTIN No results found for: CHOL, TRIG, HDL,  CHOLHDL, VLDL, LDLCALC  See Psychiatric Specialty Exam and Suicide Risk Assessment completed by Attending Physician prior to discharge.  Discharge destination:  Home  Is patient on multiple antipsychotic therapies at discharge:  No   Has Patient had three or more failed trials of antipsychotic monotherapy by history:  No  Recommended Plan for Multiple Antipsychotic Therapies: NA     Medication List    STOP taking these medications        acetaminophen 500 MG tablet  Commonly known as:  TYLENOL     ibuprofen 400 MG tablet  Commonly known as:  ADVIL,MOTRIN      TAKE these medications      Indication   citalopram 20 MG tablet  Commonly known as:  CELEXA  Take 1 tablet (20 mg total) by mouth daily.   Indication:  Aggressive Behavior     hydrOXYzine 25 MG tablet  Commonly known as:  ATARAX/VISTARIL  Take 1 tablet (25 mg total) by mouth every 6 (six) hours as needed for anxiety.   Indication:  Anxiety Neurosis     traZODone 50 MG tablet  Commonly known as:  DESYREL  Take 1 tablet (50 mg total) by mouth at bedtime and may repeat dose one time if needed.   Indication:  Trouble Sleeping  Follow-up Information    Follow up with [facility].   Specialty:  Behavioral Health   Why:  Walk-in clinic Monday-Friday between 8am to 3pm for assessment for therapy and medication management services. Please arrive early in order to be seen as quickly as possible.    [address] [place] [address] [phone]       Follow-up recommendations:  Activity:  as tolerated Diet:  heart healthy  Comments:  1.  Take all your medications as prescribed.              2.  Report any adverse side effects to outpatient provider.                       3.  Patient instructed to not use alcohol or illegal drugs while on prescription medicines.            4.  In the event of worsening symptoms, instructed patient to call 911, the crisis hotline or go to nearest  emergency room for evaluation of symptoms.  Signed: Oneta Rack, NP [facility] 04/01/2015, 3:57 PM   Patient seen face to face for psychiatric evaluation. Chart reviewed and finding discussed with Physician extender. Agreed with disposition and treatment plan.   Kathryne Sharper, MD

## 2015-04-02 DIAGNOSIS — F332 Major depressive disorder, recurrent severe without psychotic features: Principal | ICD-10-CM

## 2015-04-02 NOTE — [facility] Suicide Risk Assessment (Signed)
[facility] Discharge Suicide Risk Assessment   Principal Problem: MDD (major depressive disorder), recurrent episode, severe (HCC) Discharge Diagnoses:  Patient Active Problem List   Diagnosis Date Noted  . MDD (major depressive disorder), recurrent episode, severe (HCC) [F33.2] 03/30/2015  . No significant past medical history [IMO0001]     Total Time spent with patient: 30 minutes  Musculoskeletal: Strength & Muscle Tone: within normal limits Gait & Station: normal Patient leans: N/A  Psychiatric Specialty Exam: ROS  Blood pressure 102/81, pulse 89, temperature 98 F (36.7 C), temperature source Oral, resp. rate 16, height  (1.753 m), weight 63.957 kg (141 lb).Body mass index is 20.81 kg/(m^2).  General Appearance: Casual  Eye Contact::  Good  Speech:  Clear and Coherent409  Volume:  Normal  Mood:  NA  Affect:  Congruent  Thought Process:  Coherent  Orientation:  Full (Time, Place, and Person)  Thought Content:  WDL  Suicidal Thoughts:  No  Homicidal Thoughts:  No  Memory:  Immediate;   Good Recent;   Good Remote;   Good  Judgement:  Good  Insight:  Good  Psychomotor Activity:  Normal  Concentration:  Good  Recall:  Good  Fund of Knowledge:Good  Language: Good  Akathisia:  No  Handed:  Right  AIMS (if indicated):     Assets:  Communication Skills Desire for Improvement Financial Resources/Insurance Housing Physical Health Social Support Transportation  Sleep:  Number of Hours: 5.75  Cognition: WNL  ADL's:  Intact   Mental Status Per Nursing Assessment::   On Admission:  Suicidal ideation indicated by patient  Demographic Factors:  Male  Loss Factors: NA  Historical Factors: NA  Risk Reduction Factors:   Sense of responsibility to family, Religious beliefs about death, Living with another person, especially a relative, Positive social support, Positive therapeutic relationship and Positive coping skills or problem solving skills  Continued  Clinical Symptoms:  Previous Psychiatric Diagnoses and Treatments  Cognitive Features That Contribute To Risk:  None    Suicide Risk:  Minimal: No identifiable suicidal ideation.  Patients presenting with no risk factors but with morbid ruminations; may be classified as minimal risk based on the severity of the depressive symptoms  Follow-up Information    Follow up with [facility].   Specialty:  Behavioral Health   Why:  Walk-in clinic Monday-Friday between 8am to 3pm for assessment for therapy and medication management services. Please arrive early in order to be seen as quickly as possible.    Contact information:   [address] [place] [address] [phone]       Plan Of Care/Follow-up recommendations:  Activity:  as tolerated Diet:  un changed  Neeka Urista T., MD 04/02/2015, 8:32 AM

## 2015-04-02 NOTE — Progress Notes (Signed)
Patient denied SI & HI, contracts for safety.  Denied A/V hallucinations.  Safety maintained with 15 minute checks.  Emotional support and encouragement given patient. 

## 2015-04-02 NOTE — Progress Notes (Addendum)
Discharge Note:  Patient was picked up by his wife to go home.  Denied SI and HI.  Denied A/V hallucinations.  Suicide prevention information given and discussed with patient who stated he understood and had no questions.  Patient stated he received all his belongings, clothing, toiletries, misc items, prescriptions, medications.  Patient received all discharge information required at discharge.  Patient stated he appreciated all assistance from [facility] staff.

## 2015-06-09 ENCOUNTER — Encounter (HOSPITAL_COMMUNITY): Payer: Self-pay | Admitting: *Deleted

## 2015-06-09 ENCOUNTER — Emergency Department (HOSPITAL_COMMUNITY)
Admission: EM | Admit: 2015-06-09 | Discharge: 2015-06-09 | Discharge status: Against Medical Advice | Payer: Self-pay | Attending: Emergency Medicine | Admitting: Emergency Medicine

## 2015-06-09 ENCOUNTER — Emergency Department (HOSPITAL_COMMUNITY): Payer: Self-pay

## 2015-06-09 DIAGNOSIS — F419 Anxiety disorder, unspecified: Secondary | ICD-10-CM | POA: Insufficient documentation

## 2015-06-09 DIAGNOSIS — Z79899 Other long term (current) drug therapy: Secondary | ICD-10-CM | POA: Insufficient documentation

## 2015-06-09 DIAGNOSIS — R112 Nausea with vomiting, unspecified: Secondary | ICD-10-CM

## 2015-06-09 DIAGNOSIS — E876 Hypokalemia: Secondary | ICD-10-CM | POA: Insufficient documentation

## 2015-06-09 HISTORY — DX: Anxiety disorder, unspecified: F41.9

## 2015-06-09 LAB — COMPREHENSIVE METABOLIC PANEL
ALT: 24 U/L (ref 17–63)
AST: 39 U/L (ref 15–41)
Albumin: 4.6 g/dL (ref 3.5–5.0)
Alkaline Phosphatase: 69 U/L (ref 38–126)
Anion gap: 12 (ref 5–15)
BILIRUBIN TOTAL: 1.2 mg/dL (ref 0.3–1.2)
BUN: 23 mg/dL — AB (ref 6–20)
CALCIUM: 9.3 mg/dL (ref 8.9–10.3)
CO2: 27 mmol/L (ref 22–32)
CREATININE: 0.88 mg/dL (ref 0.61–1.24)
Chloride: 100 mmol/L — ABNORMAL LOW (ref 101–111)
Glucose, Bld: 104 mg/dL — ABNORMAL HIGH (ref 65–99)
Potassium: 3.1 mmol/L — ABNORMAL LOW (ref 3.5–5.1)
Sodium: 139 mmol/L (ref 135–145)
TOTAL PROTEIN: 8.1 g/dL (ref 6.5–8.1)

## 2015-06-09 LAB — CBC WITH DIFFERENTIAL/PLATELET
BASOS ABS: 0 10*3/uL (ref 0.0–0.1)
Basophils Relative: 0 %
Eosinophils Absolute: 0.1 10*3/uL (ref 0.0–0.7)
Eosinophils Relative: 1 %
HEMATOCRIT: 43.1 % (ref 39.0–52.0)
HEMOGLOBIN: 15.7 g/dL (ref 13.0–17.0)
LYMPHS PCT: 16 %
Lymphs Abs: 1.7 10*3/uL (ref 0.7–4.0)
MCH: 30.3 pg (ref 26.0–34.0)
MCHC: 36.4 g/dL — ABNORMAL HIGH (ref 30.0–36.0)
MCV: 83 fL (ref 78.0–100.0)
MONO ABS: 1 10*3/uL (ref 0.1–1.0)
MONOS PCT: 9 %
Neutro Abs: 8.1 10*3/uL — ABNORMAL HIGH (ref 1.7–7.7)
Neutrophils Relative %: 74 %
Platelets: 236 10*3/uL (ref 150–400)
RBC: 5.19 MIL/uL (ref 4.22–5.81)
RDW: 13.2 % (ref 11.5–15.5)
WBC: 11 10*3/uL — ABNORMAL HIGH (ref 4.0–10.5)

## 2015-06-09 LAB — LIPASE, BLOOD: LIPASE: 24 U/L (ref 11–51)

## 2015-06-09 MED ORDER — METOCLOPRAMIDE HCL 5 MG/ML IJ SOLN
10.0000 mg | Freq: Once | INTRAMUSCULAR | Status: AC
  Administered 2015-06-09: 10 mg via INTRAVENOUS
  Filled 2015-06-09: qty 2

## 2015-06-09 MED ORDER — ONDANSETRON HCL 4 MG/2ML IJ SOLN
4.0000 mg | INTRAMUSCULAR | Status: DC | PRN
  Administered 2015-06-09: 4 mg via INTRAVENOUS
  Filled 2015-06-09: qty 2

## 2015-06-09 MED ORDER — FAMOTIDINE IN NACL 20-0.9 MG/50ML-% IV SOLN
20.0000 mg | Freq: Once | INTRAVENOUS | Status: AC
  Administered 2015-06-09: 20 mg via INTRAVENOUS
  Filled 2015-06-09: qty 50

## 2015-06-09 MED ORDER — SODIUM CHLORIDE 0.9 % IV SOLN
INTRAVENOUS | Status: DC
  Administered 2015-06-09: 11:00:00 via INTRAVENOUS

## 2015-06-09 MED ORDER — POTASSIUM CHLORIDE CRYS ER 20 MEQ PO TBCR
40.0000 meq | EXTENDED_RELEASE_TABLET | Freq: Once | ORAL | Status: AC
  Administered 2015-06-09: 40 meq via ORAL
  Filled 2015-06-09: qty 2

## 2015-06-09 MED ORDER — SODIUM CHLORIDE 0.9 % IV BOLUS (SEPSIS)
1000.0000 mL | Freq: Once | INTRAVENOUS | Status: AC
  Administered 2015-06-09: 1000 mL via INTRAVENOUS

## 2015-06-09 NOTE — ED Notes (Signed)
EMS reports several days of N/V, last po intake Sunday, Chills and fever on Sunday. Weakness overall. 122/88-60-14 CBG unable to obtain. No IV

## 2015-06-09 NOTE — ED Notes (Signed)
Pt given Gingerale to drink for po challenge.

## 2015-06-09 NOTE — ED Notes (Signed)
Bed: [id number] Expected date:  Expected time:  Means of arrival:  Comments: EMS 40 yo male nausea and vomiting x 7 days, fever and chills

## 2015-06-09 NOTE — ED Notes (Addendum)
Pt reported episode of n/v after taking potassium pill.

## 2015-06-09 NOTE — ED Notes (Signed)
Upon speaking with pt, pt states s/s started on Monday, he felt better yesterday and drank fluids, started to throw up this am, pt does not appear dehydrated, VSS, mucus membranes pink and moist, lips slightly dry, not cracking.

## 2015-06-09 NOTE — ED Provider Notes (Signed)
CSN: [id number]     Arrival date & time 06/09/15  [id number] History   First MD Initiated Contact with Patient 06/09/15 [phone]     Chief Complaint  Patient presents with  . Emesis  . Nausea    HPI  Pt was seen at 0740. Per pt, c/o gradual onset and persistence of multiple intermittent episodes of N/V that began 4 days ago. Has been associated with upper abd "aching." States he was feeling somewhat better yesterday and was able to tol PO without N/V. Pt's N/V returned again today.  Denies diarrhea, no CP/SOB, no back pain, no fevers, no black or blood in stools or emesis.    Past Medical History  Diagnosis Date  . No significant past medical history   . Anxiety    History reviewed. No pertinent past surgical history.   Family History  Problem Relation Age of Onset  . Ovarian cancer Mother    Social History  Substance Use Topics  . Smoking status: Never Smoker   . Smokeless tobacco: None  . Alcohol Use: 1.2 oz/week    2 Shots of liquor per week     Comment: 2 shots of liquor every few weeks    Review of Systems ROS: Statement: All systems negative except as marked or noted in the HPI; Constitutional: Negative for fever and chills. ; ; Eyes: Negative for eye pain, redness and discharge. ; ; ENMT: Negative for ear pain, hoarseness, nasal congestion, sinus pressure and sore throat. ; ; Cardiovascular: Negative for chest pain, palpitations, diaphoresis, dyspnea and peripheral edema. ; ; Respiratory: Negative for cough, wheezing and stridor. ; ; Gastrointestinal: +N/V, abd pain. Negative for diarrhea, blood in stool, hematemesis, jaundice and rectal bleeding. . ; ; Genitourinary: Negative for dysuria, flank pain and hematuria. ; ; Musculoskeletal: Negative for back pain and neck pain. Negative for swelling and trauma.; ; Skin: Negative for pruritus, rash, abrasions, blisters, bruising and skin lesion.; ; Neuro: Negative for headache, lightheadedness and neck stiffness. Negative for weakness,  altered level of consciousness , altered mental status, extremity weakness, paresthesias, involuntary movement, seizure and syncope.        Allergies  Pineapple  Home Medications   Prior to Admission medications   Medication Sig Start Date End Date Taking? Authorizing Provider  citalopram (CELEXA) 20 MG tablet Take 1 tablet (20 mg total) by mouth daily. 04/01/15   Oneta Rackanika N Lewis, NP  hydrOXYzine (ATARAX/VISTARIL) 25 MG tablet Take 1 tablet (25 mg total) by mouth every 6 (six) hours as needed for anxiety. 04/01/15   Oneta Rackanika N Lewis, NP  traZODone (DESYREL) 50 MG tablet Take 1 tablet (50 mg total) by mouth at bedtime and may repeat dose one time if needed. 04/01/15   Oneta Rackanika N Lewis, NP   BP 125/76 mmHg  Pulse 56  Temp(Src) 98.4 F (36.9 C) (Oral)  Resp 18  Ht 5\' 9"  (1.753 m)  Wt 140 lb (63.504 kg)  BMI 20.67 kg/m2  SpO2 95% Physical Exam  0745: Physical examination:  Nursing notes reviewed; Vital signs and O2 SAT reviewed;  Constitutional: Well developed, Well nourished, Well hydrated, In no acute distress; Head:  Normocephalic, atraumatic; Eyes: EOMI, PERRL, No scleral icterus; ENMT: Mouth and pharynx normal, Mucous membranes moist; Neck: Supple, Full range of motion, No lymphadenopathy; Cardiovascular: Regular rate and rhythm, No murmur, rub, or gallop; Respiratory: Breath sounds clear & equal bilaterally, No rales, rhonchi, wheezes.  Speaking full sentences with ease, Normal respiratory effort/excursion; Chest: Nontender, Movement normal; Abdomen:  Soft, +mid-epigastric tenderness to palp. No rebound or guarding. Nondistended, Normal bowel sounds; Genitourinary: No CVA tenderness; Extremities: Pulses normal, No tenderness, No edema, No calf edema or asymmetry.; Neuro: AA&Ox3, Major CN grossly intact.  Speech clear. No gross focal motor or sensory deficits in extremities. Climbs on and off stretcher easily by himself. Gait steady.; Skin: Color normal, Warm, Dry.   ED Course  Procedures  (including critical care time) Labs Review   Imaging Review   I have personally reviewed and evaluated these images and lab results as part of my medical decision-making.   EKG Interpretation None      MDM  MDM Reviewed: previous chart, nursing note and vitals Reviewed previous: labs Interpretation: labs and x-ray     Results for orders placed or performed during the hospital encounter of 06/09/15  Comprehensive metabolic panel  Result Value Ref Range   Sodium 139 135 - 145 mmol/L   Potassium 3.1 (L) 3.5 - 5.1 mmol/L   Chloride 100 (L) 101 - 111 mmol/L   CO2 27 22 - 32 mmol/L   Glucose, Bld 104 (H) 65 - 99 mg/dL   BUN 23 (H) 6 - 20 mg/dL   Creatinine, Ser 1.61 0.61 - 1.24 mg/dL   Calcium 9.3 8.9 - 09.6 mg/dL   Total Protein 8.1 6.5 - 8.1 g/dL   Albumin 4.6 3.5 - 5.0 g/dL   AST 39 15 - 41 U/L   ALT 24 17 - 63 U/L   Alkaline Phosphatase 69 38 - 126 U/L   Total Bilirubin 1.2 0.3 - 1.2 mg/dL   GFR calc non Af Amer >60 >60 mL/min   GFR calc Af Amer >60 >60 mL/min   Anion gap 12 5 - 15  Lipase, blood  Result Value Ref Range   Lipase 24 11 - 51 U/L  CBC with Differential  Result Value Ref Range   WBC 11.0 (H) 4.0 - 10.5 K/uL   RBC 5.19 4.22 - 5.81 MIL/uL   Hemoglobin 15.7 13.0 - 17.0 g/dL   HCT 04.5 40.9 - 81.1 %   MCV 83.0 78.0 - 100.0 fL   MCH 30.3 26.0 - 34.0 pg   MCHC 36.4 (H) 30.0 - 36.0 g/dL   RDW 91.4 78.2 - 95.6 %   Platelets 236 150 - 400 K/uL   Neutrophils Relative % 74 %   Neutro Abs 8.1 (H) 1.7 - 7.7 K/uL   Lymphocytes Relative 16 %   Lymphs Abs 1.7 0.7 - 4.0 K/uL   Monocytes Relative 9 %   Monocytes Absolute 1.0 0.1 - 1.0 K/uL   Eosinophils Relative 1 %   Eosinophils Absolute 0.1 0.0 - 0.7 K/uL   Basophils Relative 0 %   Basophils Absolute 0.0 0.0 - 0.1 K/uL   Dg Abd Acute W/chest 06/09/2015  CLINICAL DATA:  Nausea and vomiting and abdominal pain EXAM: DG ABDOMEN ACUTE W/ 1V CHEST COMPARISON:  11/16/2014 CT FINDINGS: There is no evidence of  dilated bowel loops or free intraperitoneal air. No radiopaque calculi or other significant radiographic abnormality is seen. Heart size and mediastinal contours are within normal limits. Both lungs are clear. IMPRESSION: Negative abdominal radiographs.  No acute cardiopulmonary disease. Electronically Signed   By: Marnee Spring M.D.   On: 06/09/2015 08:26    1045:  Pt with N/V after PO challenge. Also unable to take potassium PO without N/V. Refuses to be re-medicated for his symptoms. States he wants his IV out and leave.  Pt and family  informed re: dx testing results, and persistent inability to tol PO.  Pt refuses to be re-medicated and re-try PO challenge.  I encouraged pt to stay, continues to refuse.  Pt makes his own medical decisions.  Risks of AMA explained to pt and family, including, but not limited to:  Dehydration, stroke, heart attack, cardiac arrythmia ("irregular heart rate/beat"), "passing out," temporary and/or permanent disability, death.  Pt and family verb understanding and continue to refuse to stay for further treatment, understanding the consequences of their decision.  I encouraged pt to follow up with his PMD tomorrow and return to the ED immediately if symptoms worsen, persist, or for any other concerns.  Pt and family verb understanding, agreeable.    Samuel Jester, DO 06/11/15 1415

## 2017-04-22 IMAGING — CT CT ABD-PELV W/ CM
2 of 4 series · 16 of 46 positions shown, 18 images · IV contrast (APPLIED)
Comparison: None.

CLINICAL DATA: Abdominal pain

EXAM:
CT ABDOMEN AND PELVIS WITH CONTRAST
TECHNIQUE: Multidetector CT imaging of the abdomen and pelvis was performed
using the standard protocol following bolus administration of
intravenous contrast.
CONTRAST:  100mL OMNIPAQUE IOHEXOL 300 MG/ML  SOLN

[Series 2: abd/ pelvis 5.0 i30f 1 · axial · 0.63mm/px · z∈[+1003,+1383]mm · 13 of 84 slices shown, 15 images]
[im 4/84  soft-tissue]
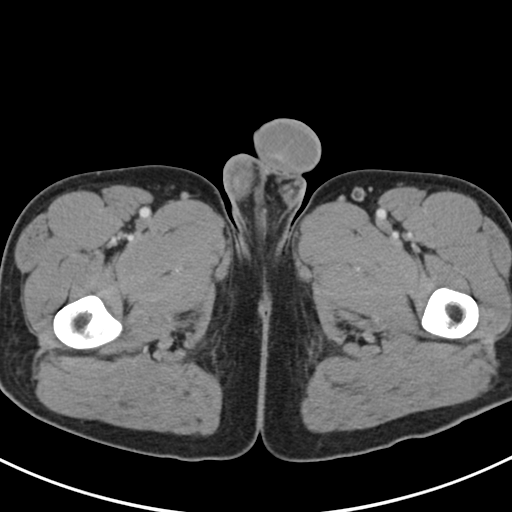
[im 4/84  bone]
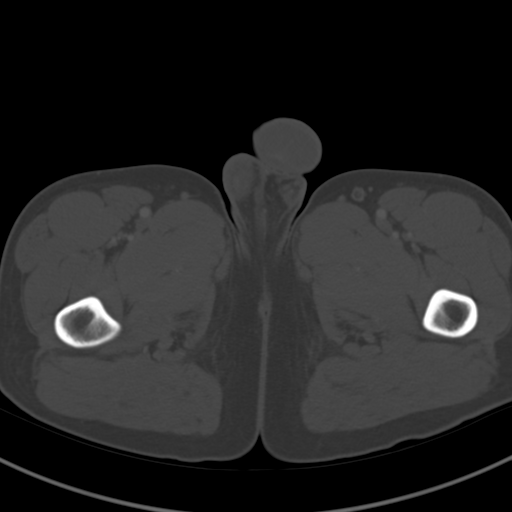
[im 10/84  soft-tissue]
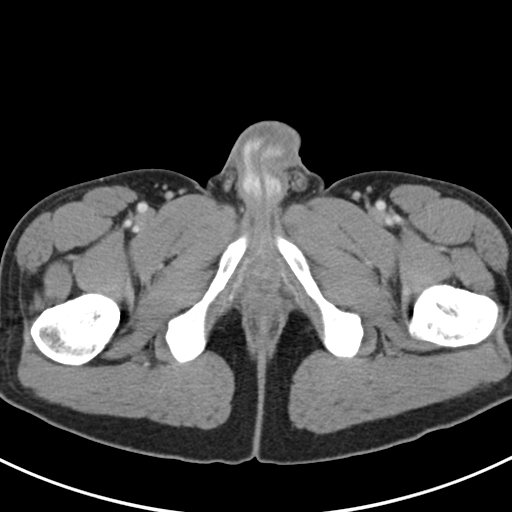
[im 16/84  soft-tissue]
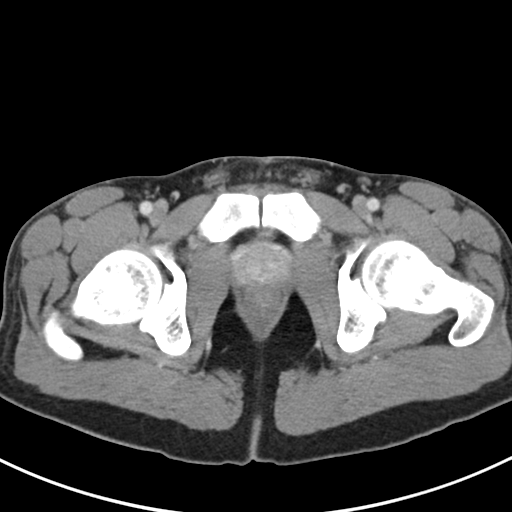
[im 23/84  soft-tissue]
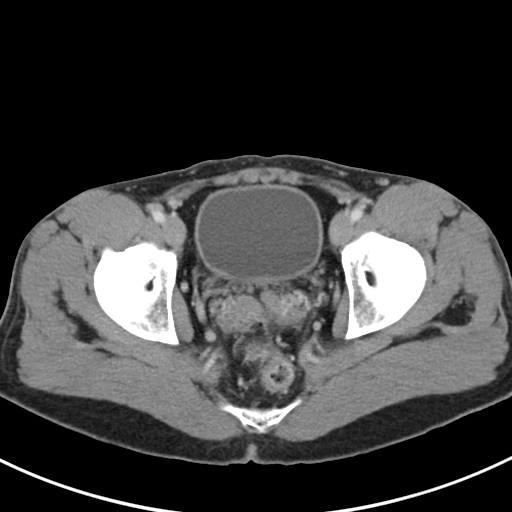
[im 29/84  soft-tissue]
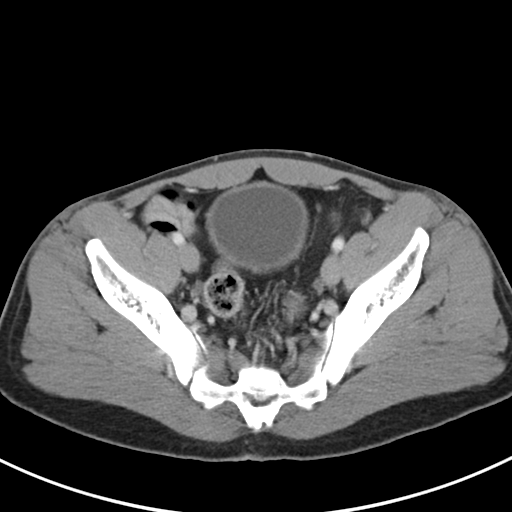
[im 36/84  soft-tissue]
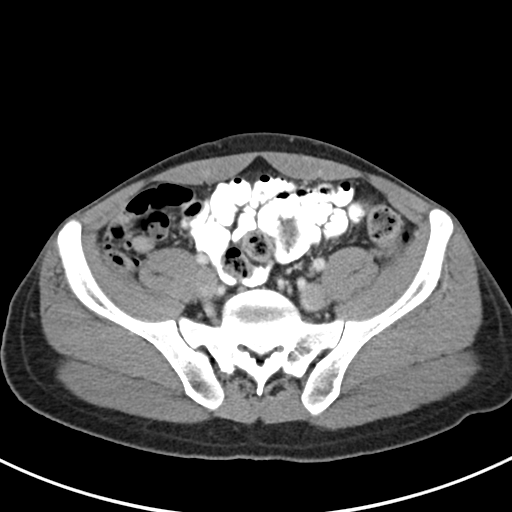
[im 42/84  soft-tissue]
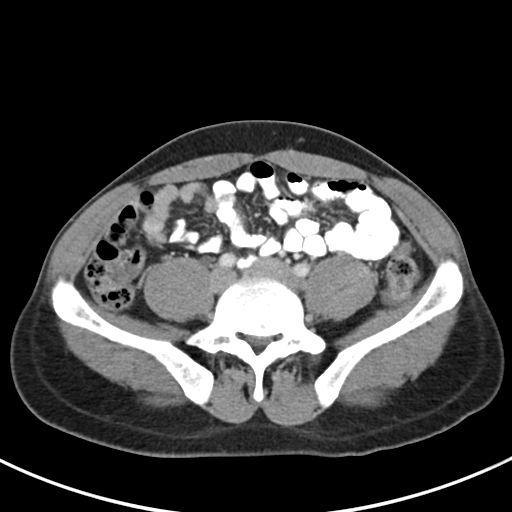
[im 48/84  soft-tissue]
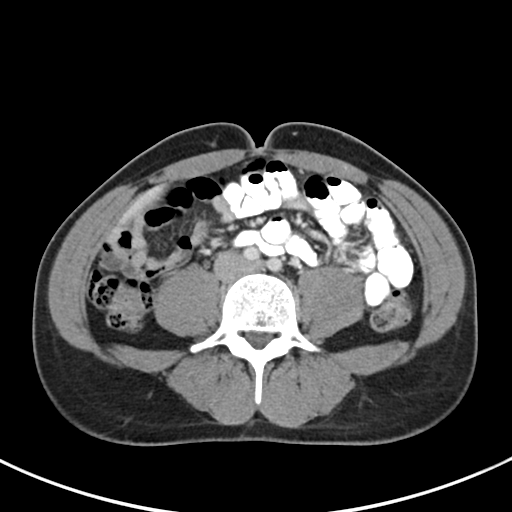
[im 55/84  soft-tissue]
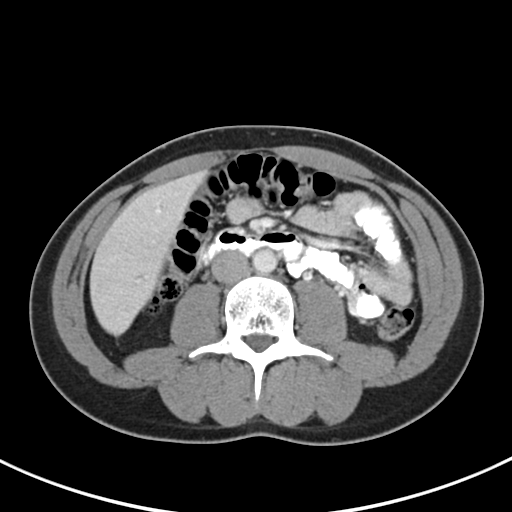
[im 55/84  bone]
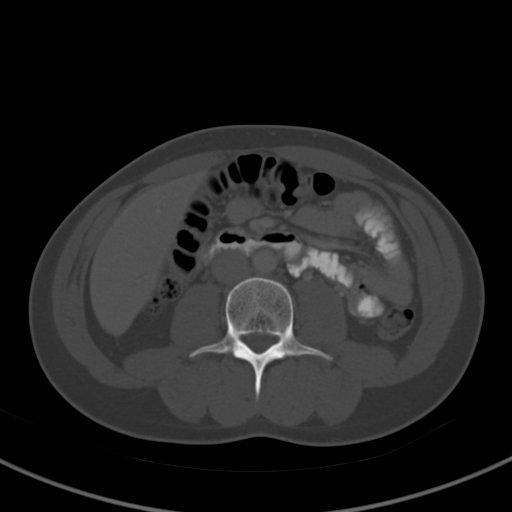
[im 61/84  soft-tissue]
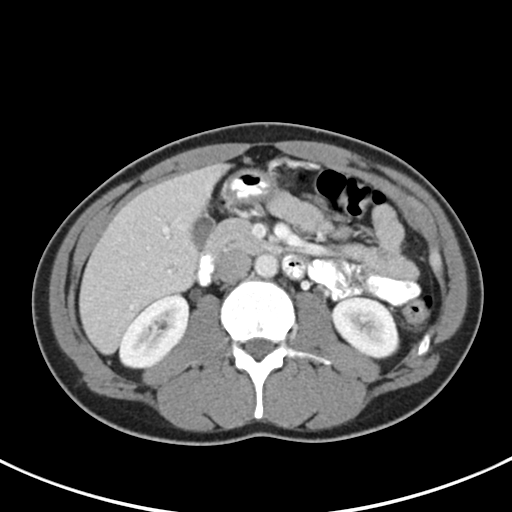
[im 68/84  soft-tissue]
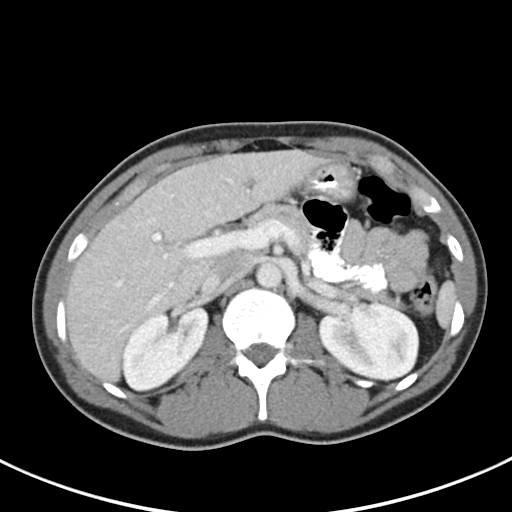
[im 74/84  soft-tissue]
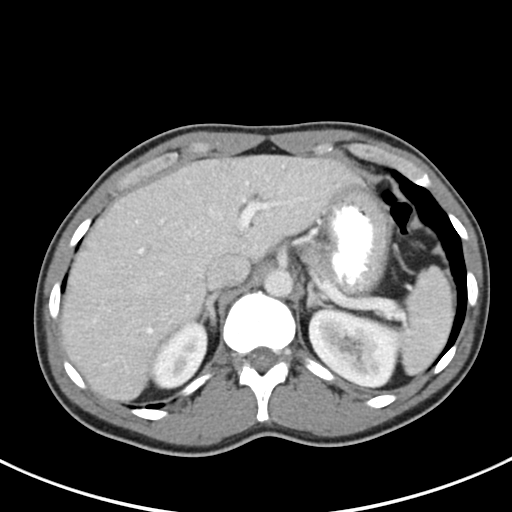
[im 80/84  soft-tissue]
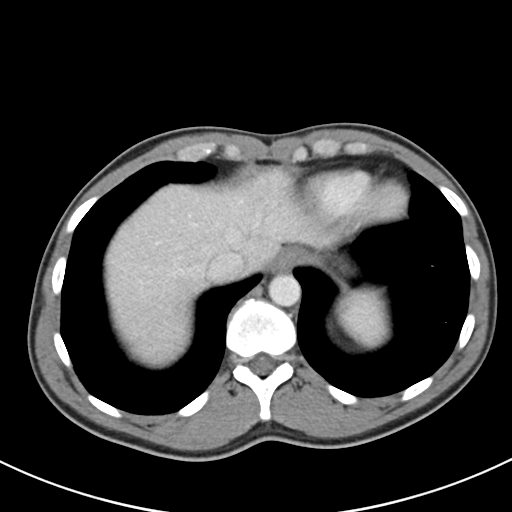

[Series 5: coronal soft tissue · coronal · 0.61mm/px · 3 of 74 slices shown]
[im 25/74  soft-tissue]
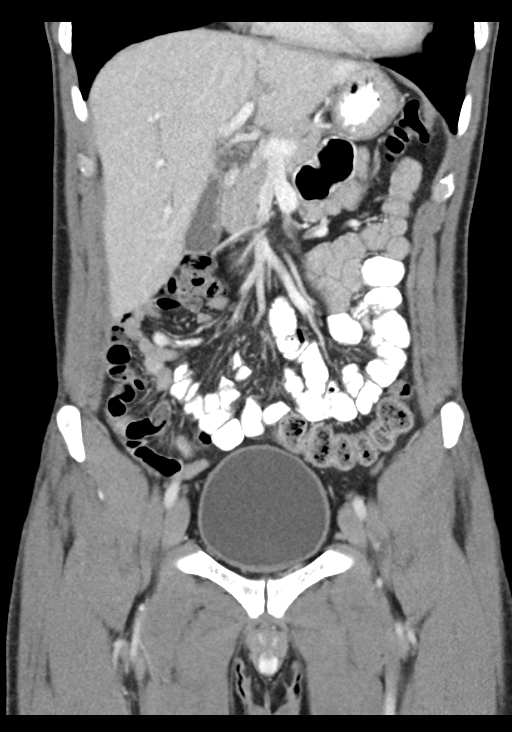
[im 33/74  soft-tissue]
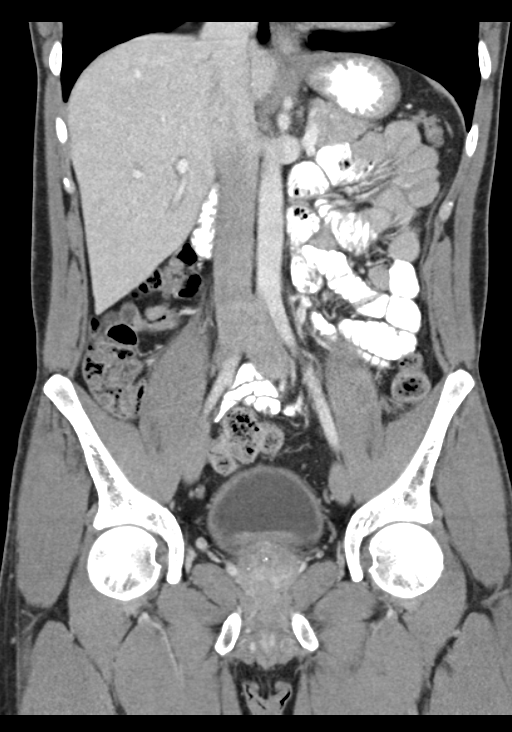
[im 41/74  soft-tissue]
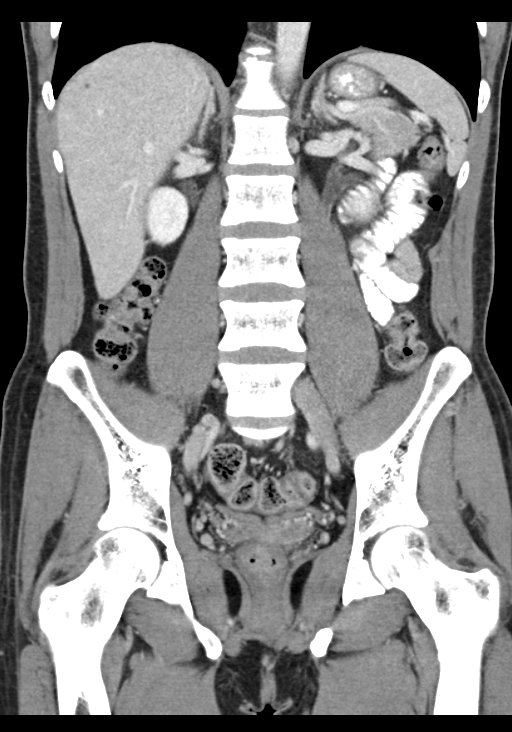

[16 of 46 positions shown; findings below may reference images not displayed]

FINDINGS: Lower chest:  No acute findings.

Hepatobiliary: No masses or other significant abnormality.

Pancreas: No mass, inflammatory changes, or other significant
abnormality.

Spleen: Within normal limits in size and appearance.

Adrenals/Urinary Tract: No masses identified. No evidence of
hydronephrosis.

Stomach/Bowel: No evidence of obstruction, inflammatory process, or
abnormal fluid collections. Appendix is visualized and is normal.

Vascular/Lymphatic: No pathologically enlarged lymph nodes. No
evidence of abdominal aortic aneursym.

Reproductive: No mass or other significant abnormality.

Other: No free fluid.

Musculoskeletal:  No suspicious bone lesions identified.
IMPRESSION: Unremarkable abdominal CT.

## 2017-11-13 IMAGING — CR DG ABDOMEN ACUTE W/ 1V CHEST
3 series · 3 of 3 positions shown · non-contrast
Comparison: 11/16/2014 CT

CLINICAL DATA: Nausea and vomiting and abdominal pain

EXAM:
DG ABDOMEN ACUTE W/ 1V CHEST

[w chest pa]
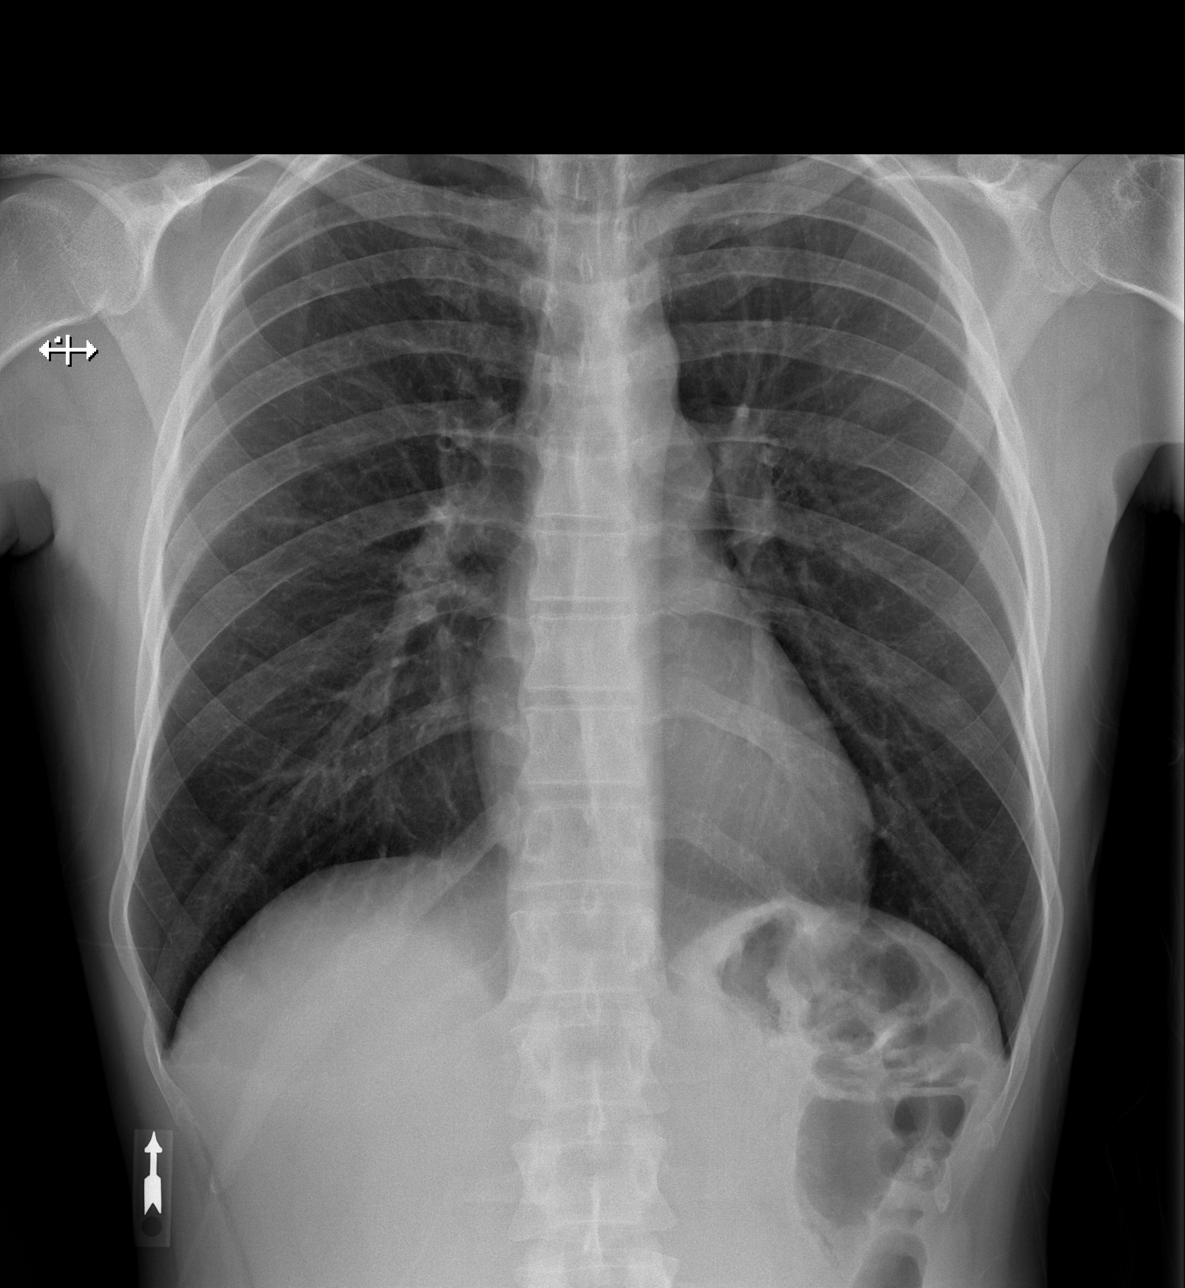

[w abdomen upright]
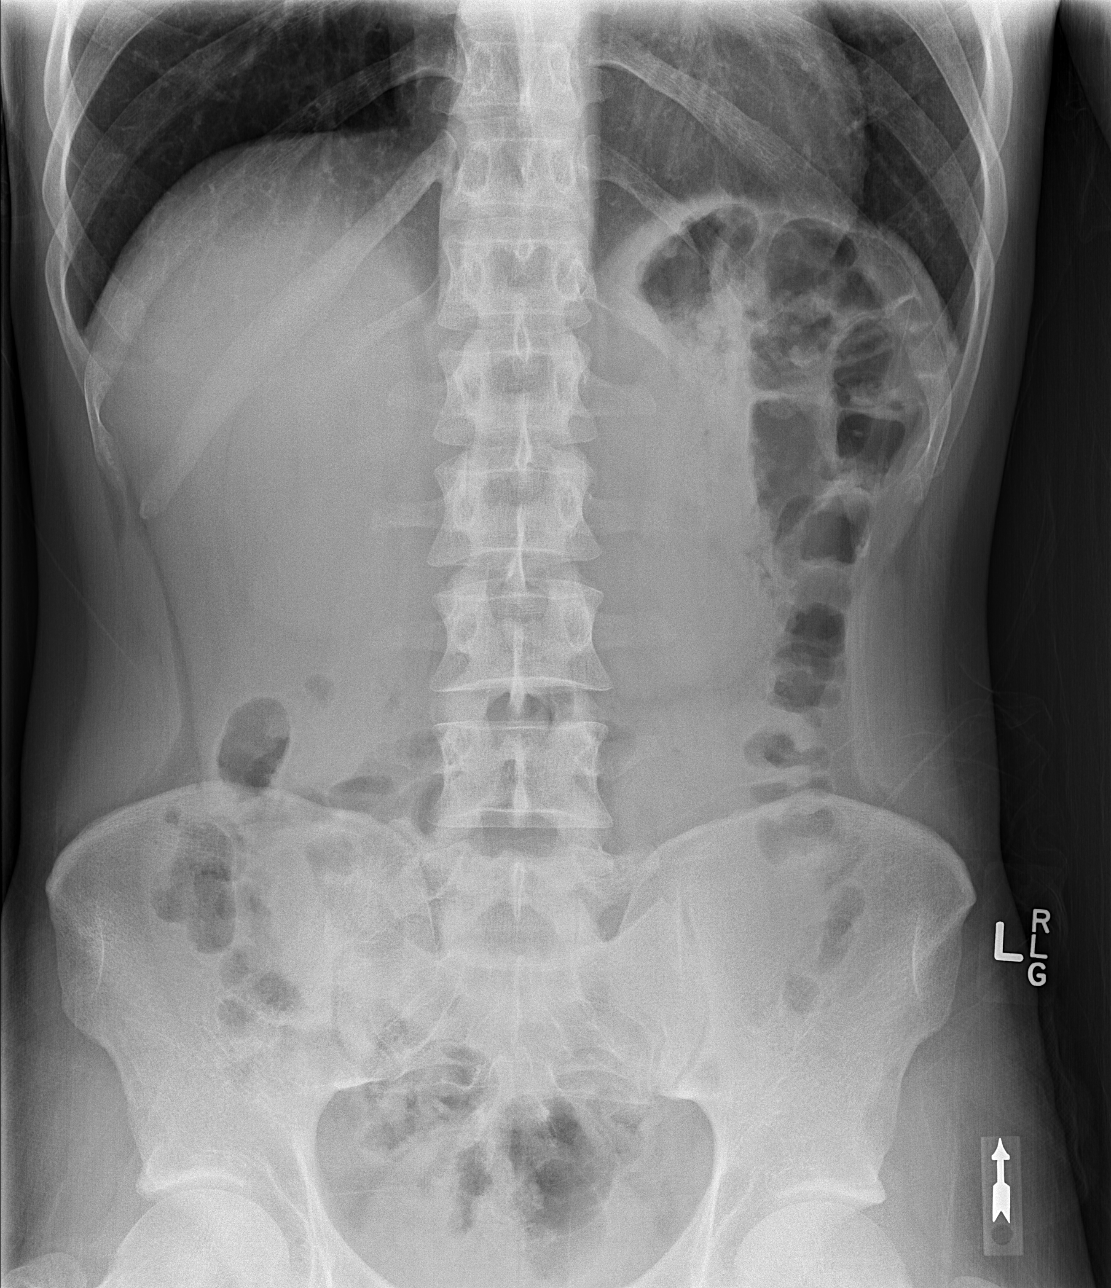

[t abdomen supine]
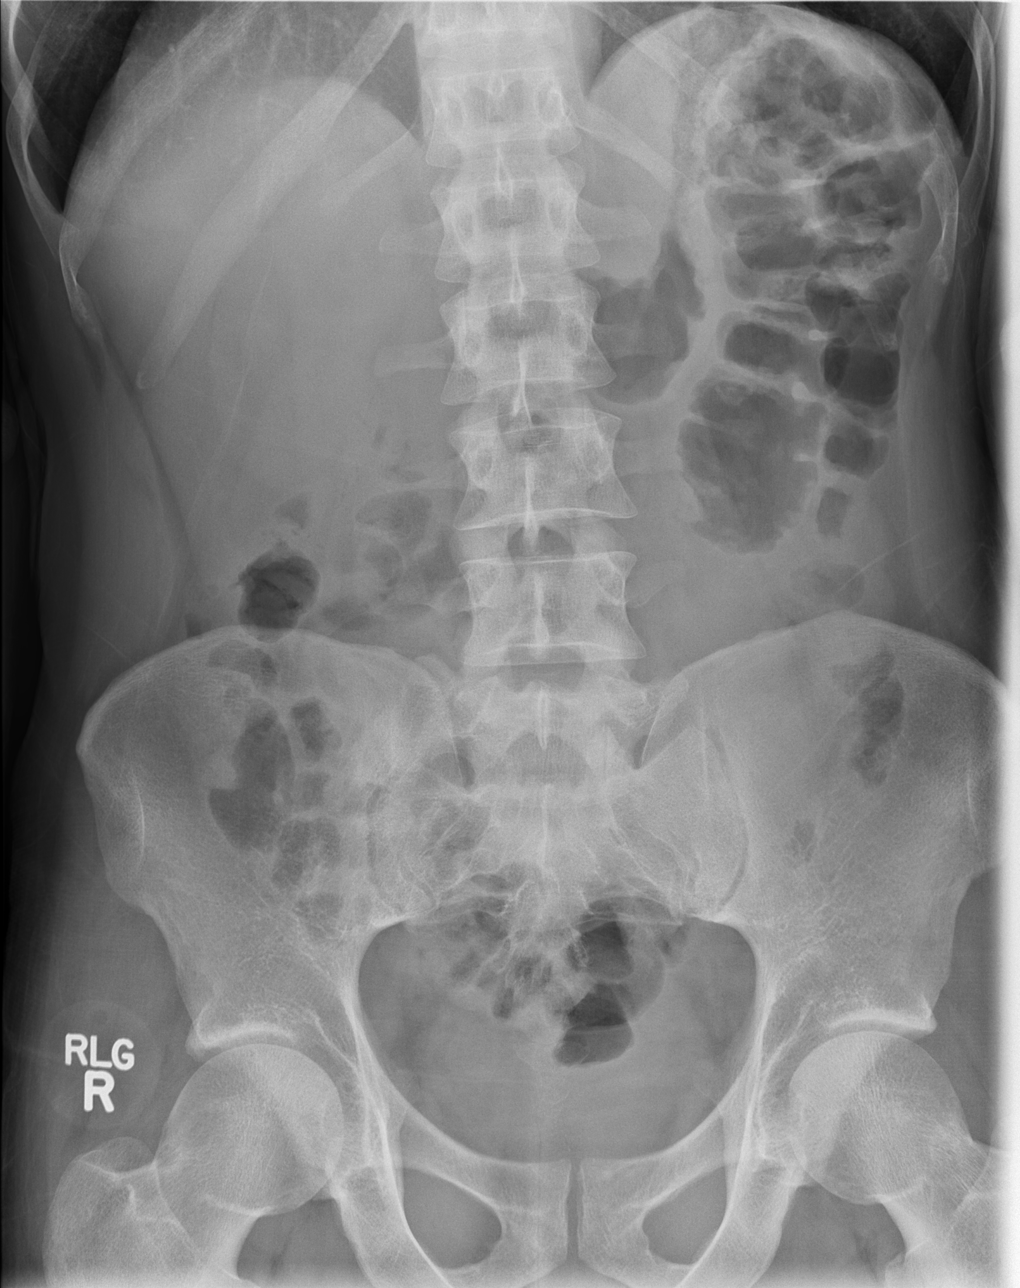

[3 of 3 positions shown; findings below may reference images not displayed]

FINDINGS: There is no evidence of dilated bowel loops or free intraperitoneal
air. No radiopaque calculi or other significant radiographic
abnormality is seen. Heart size and mediastinal contours are within
normal limits. Both lungs are clear.
IMPRESSION: Negative abdominal radiographs.  No acute cardiopulmonary disease.
